# Patient Record
Sex: Female | Born: 1975 | Race: White | Hispanic: No | Marital: Married | Smoking: Never smoker
Health system: Southern US, Community
[De-identification: ages and names within clinical notes are randomized; demographics above are authoritative.]

## PROBLEM LIST (undated history)

## (undated) DIAGNOSIS — J45909 Unspecified asthma, uncomplicated: Secondary | ICD-10-CM

## (undated) HISTORY — PX: BUNIONECTOMY: SHX129

## (undated) HISTORY — PX: APPENDECTOMY: SHX54

## (undated) HISTORY — PX: BREAST CYST EXCISION: SHX579

## (undated) HISTORY — PX: BREAST SURGERY: SHX581

## (undated) HISTORY — DX: Unspecified asthma, uncomplicated: J45.909

---

## 1998-11-25 ENCOUNTER — Other Ambulatory Visit: Admission: RE | Admit: 1998-11-25 | Discharge: 1998-11-25 | Payer: Self-pay | Admitting: *Deleted

## 1999-11-22 ENCOUNTER — Other Ambulatory Visit: Admission: RE | Admit: 1999-11-22 | Discharge: 1999-11-22 | Payer: Self-pay | Admitting: Obstetrics & Gynecology

## 2000-05-13 ENCOUNTER — Inpatient Hospital Stay (HOSPITAL_COMMUNITY): Admission: AD | Admit: 2000-05-13 | Discharge: 2000-05-13 | Payer: Self-pay | Admitting: Obstetrics & Gynecology

## 2000-06-15 ENCOUNTER — Inpatient Hospital Stay (HOSPITAL_COMMUNITY): Admission: AD | Admit: 2000-06-15 | Discharge: 2000-06-17 | Payer: Self-pay | Admitting: Obstetrics and Gynecology

## 2000-07-16 ENCOUNTER — Other Ambulatory Visit: Admission: RE | Admit: 2000-07-16 | Discharge: 2000-07-16 | Payer: Self-pay | Admitting: Obstetrics & Gynecology

## 2001-08-05 ENCOUNTER — Other Ambulatory Visit: Admission: RE | Admit: 2001-08-05 | Discharge: 2001-08-05 | Payer: Self-pay | Admitting: Obstetrics & Gynecology

## 2002-10-08 ENCOUNTER — Other Ambulatory Visit: Admission: RE | Admit: 2002-10-08 | Discharge: 2002-10-08 | Payer: Self-pay | Admitting: Obstetrics & Gynecology

## 2004-10-31 ENCOUNTER — Ambulatory Visit: Payer: Self-pay | Admitting: Family Medicine

## 2005-01-02 ENCOUNTER — Ambulatory Visit: Payer: Self-pay | Admitting: Family Medicine

## 2005-02-08 ENCOUNTER — Ambulatory Visit: Payer: Self-pay | Admitting: Family Medicine

## 2005-03-08 ENCOUNTER — Ambulatory Visit: Payer: Self-pay | Admitting: Family Medicine

## 2005-10-02 ENCOUNTER — Ambulatory Visit: Payer: Self-pay | Admitting: Family Medicine

## 2006-01-04 ENCOUNTER — Ambulatory Visit (HOSPITAL_COMMUNITY): Admission: RE | Admit: 2006-01-04 | Discharge: 2006-01-04 | Payer: Self-pay | Admitting: Obstetrics and Gynecology

## 2006-01-22 ENCOUNTER — Ambulatory Visit: Payer: Self-pay | Admitting: Family Medicine

## 2006-06-22 ENCOUNTER — Inpatient Hospital Stay (HOSPITAL_COMMUNITY): Admission: AD | Admit: 2006-06-22 | Discharge: 2006-06-22 | Payer: Self-pay | Admitting: Obstetrics and Gynecology

## 2006-06-29 ENCOUNTER — Inpatient Hospital Stay (HOSPITAL_COMMUNITY): Admission: AD | Admit: 2006-06-29 | Discharge: 2006-07-01 | Payer: Self-pay | Admitting: Obstetrics & Gynecology

## 2006-11-20 ENCOUNTER — Telehealth: Payer: Self-pay | Admitting: Family Medicine

## 2007-01-09 ENCOUNTER — Ambulatory Visit: Payer: Self-pay | Admitting: Family Medicine

## 2007-01-09 DIAGNOSIS — J069 Acute upper respiratory infection, unspecified: Secondary | ICD-10-CM | POA: Insufficient documentation

## 2007-01-09 DIAGNOSIS — J45909 Unspecified asthma, uncomplicated: Secondary | ICD-10-CM | POA: Insufficient documentation

## 2007-04-28 ENCOUNTER — Encounter: Payer: Self-pay | Admitting: Family Medicine

## 2007-06-30 ENCOUNTER — Ambulatory Visit: Payer: Self-pay | Admitting: Family Medicine

## 2007-06-30 DIAGNOSIS — J329 Chronic sinusitis, unspecified: Secondary | ICD-10-CM | POA: Insufficient documentation

## 2007-07-15 ENCOUNTER — Telehealth: Payer: Self-pay | Admitting: Family Medicine

## 2007-07-22 ENCOUNTER — Ambulatory Visit: Payer: Self-pay | Admitting: Family Medicine

## 2007-07-22 ENCOUNTER — Encounter: Admission: RE | Admit: 2007-07-22 | Discharge: 2007-07-22 | Payer: Self-pay | Admitting: Family Medicine

## 2007-07-22 DIAGNOSIS — R0602 Shortness of breath: Secondary | ICD-10-CM

## 2007-07-22 DIAGNOSIS — R05 Cough: Secondary | ICD-10-CM

## 2007-07-23 LAB — CONVERTED CEMR LAB
Basophils Absolute: 0 10*3/uL (ref 0.0–0.1)
Basophils Relative: 0 % (ref 0–1)
Eosinophils Absolute: 0 10*3/uL (ref 0.0–0.7)
MCHC: 33 g/dL (ref 30.0–36.0)
MCV: 89.6 fL (ref 78.0–100.0)
Neutrophils Relative %: 55 % (ref 43–77)
Platelets: 246 10*3/uL (ref 150–400)

## 2007-09-29 ENCOUNTER — Ambulatory Visit: Payer: Self-pay | Admitting: Family Medicine

## 2007-09-29 DIAGNOSIS — H9209 Otalgia, unspecified ear: Secondary | ICD-10-CM | POA: Insufficient documentation

## 2007-11-04 ENCOUNTER — Telehealth: Payer: Self-pay | Admitting: Family Medicine

## 2008-01-13 ENCOUNTER — Encounter: Payer: Self-pay | Admitting: Family Medicine

## 2010-07-04 NOTE — Consult Note (Signed)
[name], [name] NO.:  [id number]   MEDICAL RECORD NO.:  [id number]          PATIENT TYPE:  MAT   LOCATION:  [facility]C                          FACILITY:  [facility]   PHYSICIAN:  Lenoard Aden, M.D.DATE OF BIRTH:  01-30-1976   DATE OF CONSULTATION:  DATE OF DISCHARGE:                                 CONSULTATION   CHIEF COMPLAINT:  Rule out labor.   She is a 35 year old white female, G2, P1, at 38 weeks' gestation who  presents with right sided discomfort and questionable contractions this  evening.   SHE HAS NO KNOWN DRUG ALLERGIES.   MEDICATIONS:  Prenatal vitamins.   Pregnancy course complicated by cerclage placement with removal at 36  weeks, preterm labor with p.r.n. Procardia use - now discontinued.   She has a past medical history remarkable for:  1. 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  2. History of vaginal delivery.  3. Appendectomy.  4. History of excision of fibroadenoma from her breast.  5. History of migraines.  6. History of pregnancy induced hypertension with her first pregnancy.   FAMILY HISTORY:  Heart disease, hypertension, emphysema, diabetes, brain  cancer, and alcohol abuse.   Pervious pregnancy is a term delivery of an 8-pound 4-ounce female,  complicated by mild pre-eclampsia.   PHYSICAL EXAMINATION:  GENERAL:  She is a well-developed, well-  nourished, white female in no acute distress.  HEENT:  Normal.  LUNGS:  Clear.  NECK:  Supple.  Full range of motion.  HEART:  Regular rhythm.  ABDOMEN:  Soft, gravida, nontender.  Estimated fetal weight 7-1/2 to 8  pounds.  PELVIC:  Cervix is 2-to-3-cm, 80% vertex, 0 station.  EXTREMITIES:  Reveal no cords.  NEUROLOGIC:  Nonfocal.  SKIN:  Intact.   NST is reactive with irregular contractions noted.   IMPRESSION:  1. A 37-week obstetrical patient.  2. Prodromal pattern, no evidence of active labor, no evidence of      cervical change.  3. Musculoskeletal versus fetal positioning  discomfort.  No acute      abdomen or abdominal pain noted.   PLAN:  Reassurance given.  Discharge home.  Labor warnings given.  Fetal  activity discussed.      Lenoard Aden, M.D.  Electronically Signed     RJT/MEDQ  D:  06/22/2006  T:  06/22/2006  Job:  [id number]

## 2010-07-04 NOTE — H&P (Signed)
NAME:  [name], [name]             ACCOUNT NO.:  [id number]   MEDICAL RECORD NO.:  [id number]          PATIENT TYPE:  INP   LOCATION:  9165                          [facility]ILITY:  [facility]   PHYSICIAN:  Lenoard Aden, M.D.DATE OF BIRTH:  06/30/1975   DATE OF ADMISSION:  06/29/2006  DATE OF DISCHARGE:                              HISTORY & PHYSICAL   CHIEF COMPLAINT:  Spontaneous rupture of membranes, questionably this  morning versus last night with fluid draining down her legs as noted  by the patient. She denies contractions. She reports good fetal  movement, and she denies fevers, chills or abdominal pain.   She has no known drug allergies.   MEDICATIONS:  Prenatal vitamins.   She has a history of a LEEP with subsequent cerclage placement this  pregnancy. Otherwise, uncomplicated other than preterm labor requiring  p.r.n. Procardia use. She has a history of appendectomy, history of  vaginal delivery x1, history of gestational hypertension, migraines  headaches.   She is a nonsmoker, nondrinker. She denies domestic or physical  violence.   FAMILY HISTORY:  Heart disease, hypertension, emphysema, diabetes, brain  and colon cancer and alcohol use.   PHYSICAL EXAMINATION:  She is a well-developed, well-nourished, white  female in no acute distress.  HEENT:  Normal.  LUNGS:  Clear.  HEART:  Regular rhythm.  ABDOMEN:  Soft, gravid, nontender. Estimated fetal weight 8 to 8-1/2  pounds. Cervix is 4 cm, 90% vertex, and 0 station.  EXTREMITIES:  No cords.  NEUROLOGICAL EXAM:  Nonfocal.  SKIN:  Intact.   Fetal heart rate is reactive. Contractions are mild and every 5 minutes.   IMPRESSION:  1. A 38-week intrauterine pregnancy.  2. Questionable spontaneous rupture of membranes, not noted on exam.      Questionable slow leak.   PLAN:  Will proceed with augmentation, epidural and attempts at vaginal  delivery as noted. Monitor signs and symptoms for chorioamnionitis as   noted.      Lenoard Aden, M.D.  Electronically Signed     RJT/MEDQ  D:  06/29/2006  T:  06/29/2006  Job:  [id number]   cc:   Lenoard Aden, M.D.  Fax: [phone]

## 2010-07-07 NOTE — Op Note (Signed)
NAMEGEORGEANNA, [name]             ACCOUNT NO.:  [id number]   MEDICAL RECORD NO.:  [id number]          PATIENT TYPE:  AMB   LOCATION:  [facility]C                           [facility]:  [facility]   PHYSICIAN:  Lenoard Aden, M.D.DATE OF BIRTH:  10-11-1975   DATE OF PROCEDURE:  01/04/2006  DATE OF DISCHARGE:                               OPERATIVE REPORT   PREOPERATIVE DIAGNOSES:  1. Thirteen week intrauterine pregnancy with cervical insufficiency.  2. History of loop electrosurgical excision procedure times two.   POSTOPERATIVE DIAGNOSES:  1. Thirteen week intrauterine pregnancy with cervical insufficiency.  2. History of loop electrosurgical excision procedure times two.   PROCEDURE:  McDonald cervical cerclage.   SURGEON:  Lenoard Aden, M.D.   ANESTHESIA:  Spinal.   ESTIMATED BLOOD LOSS:  Less than 50 mL.   COMPLICATIONS:  None.   DRAINS:  None.   COUNTS:  Correct.   Patient to Recovery in good condition.   BRIEF OPERATIVE NOTE:  After being apprised of the risks of anesthesia,  infection, bleeding, __________ need for repair, small risk of  miscarriage, inability to prevent early delivery, the patient was  brought to the operating room after auscultating fetal heart tones.  After achieving adequate anesthesia with spinal, a weighted speculum was  placed.  Feet were placed in the __________ stirrups and cervix was  identified.  After placement of a weighted speculum, the anterior lip of  the cervix was grasped.  A 5 Ethibond suture was placed in the standard  pursestring fashion from 1 o'clock to 11 o'clock, 11 o'clock to 7  o'clock, 7 o'clock to 5 o'clock, 5 o'clock to 2 o'clock, and then 2  o'clock to 1 o'clock and tied down over a Prolene suture.  A good  coaptation of the internal and external cervical os noted.  Minimal  bleeding is noted.  Patient tolerates procedure well, is transferred to  recovery in good condition.     Lenoard Aden, M.D.  Electronically  Signed    RJT/MEDQ  D:  01/04/2006  T:  01/04/2006  Job:  [id number]

## 2010-07-07 NOTE — H&P (Signed)
[facility]  Patient:    [name], [name]                       MRN: [id number] Adm. Date:  [id number] Attending:  Silverio Lay A                         History and Physical  DATE OF BIRTH:                August 02, 1975  HISTORY AND PHYSICAL:         The patient is a 35 year old gravida 1, para 0, AB 0.  Last menstrual period September 18, 1999, for an expected date of delivery Jun 24, 2000, at 38 weeks and 5 days gestation.  REASON FOR ADMISSION:         Induction for borderline PIH.  HISTORY OF PRESENT ILLNESS:   For the last two weeks, the patient showed borderline increase in blood pressure with occasional visual symptoms and increasing generalized edema.  Labs were negative two weeks ago, and were not repeated more recently because of absence of PIH symptoms with complete bed rest.  Fetal movements positive.  No vaginal bleeding.  No fluid leak, and no regular uterine contractions.  Last vaginal exam at the office was favorable, 3 cm dilated, 80-90% effaced, vertex -1 to 0.  PAST MEDICAL HISTORY:         Negative.  PAST SURGICAL HISTORY:        Positive for _______ myomectomy in 1989 and 1990, and appendectomy in 1990.  PAST FAMILIAL HISTORY:        Positive for chronic hypertension, father and MI in father and grandmother.  MEDICATIONS:                  Prenatal vitamins.  SOCIAL HISTORY:               Married, nonsmoker.  LABORATORY DATA:              In the first trimester showed hemoglobin of 13.3, platelets 348, A-positive, antibodies negative, RPR negative, HBsAg negative, HIV negative, rubella positive.  The patient suffered from nausea and vomiting in the first trimester which improved after 16 weeks.  Triple test was within normal limits at 16+ weeks. Hemoglobin was 11.8.  Ultrasound review of anatomy was within normal limits at 28 weeks.  Placenta was normal.  Amniotic fluid normal and _______.  At 28 weeks, one-hour GTT was  within normal limits.  Cervix was reassuring.  In the third trimester group B strep was negative.  Refer to HPI for blood pressures.  REVIEW OF SYSTEMS:            Constitutional negative.  HEENT negative. Cardiovascular negative.  Respiratory negative.  GI negative.  Urologic negative.  Dermatology negative.  Neurologic - see HPI.  Endocrinology negative.  PHYSICAL EXAMINATION:  GENERAL:                      No apparent distress.  VITAL SIGNS:                  Blood pressure on admission 135/85, and then down to 115-120/60-70.  Pulse regular in the 80s.  Temperature 97.4, respiratory rate 18.  LUNGS:                        Clear bilaterally.  HEART:                        Regular cardiac rhythm, no murmur.  ABDOMEN:                      Gravida, uterine height around 38 cm cephalic.  VAGINAL EXAMINATION:          On admission, 2 cm, 75%, vertex -1 to 0.  EXTREMITIES:                  Lower limbs with moderate edema, _______.  DTRs 2/4 bilaterally.  MONITORING:                   Baseline 140 per minute, NST reactive, no decelerations.  No uterine contractions.  IMPRESSION:                   Gravida 1, 38 weeks and 5 days gestation with                               borderline pregnancy-induced hypertension, and                               favorable cervix, group B streptococcus                               negative.  PLAN:                         Admit to labor and delivery, elective induction with artificial rupture of membranes and Pitocin, monitoring, expectant delivery towards probable vaginal delivery. DD:  06/15/00 TD:  06/16/00 Job: [id number]

## 2012-02-09 ENCOUNTER — Emergency Department (INDEPENDENT_AMBULATORY_CARE_PROVIDER_SITE_OTHER)
Admission: EM | Admit: 2012-02-09 | Discharge: 2012-02-09 | Disposition: A | Discharge status: Home / Self Care | Payer: BC Managed Care – PPO | Source: Home / Self Care | Attending: Family Medicine | Admitting: Family Medicine

## 2012-02-09 ENCOUNTER — Encounter: Payer: Self-pay | Admitting: Emergency Medicine

## 2012-02-09 DIAGNOSIS — J029 Acute pharyngitis, unspecified: Secondary | ICD-10-CM

## 2012-02-09 LAB — POCT RAPID STREP A (OFFICE): Rapid Strep A Screen: NEGATIVE

## 2012-02-09 MED ORDER — PENICILLIN V POTASSIUM 500 MG PO TABS: 500.0000 mg | ORAL_TABLET | Freq: Three times a day (TID) | ORAL | Status: DC

## 2012-02-09 NOTE — ED Notes (Signed)
Reports fever and sore throat x 3 days; works in elementary school where STrep is present. Did have Flu vaccination this season in August.

## 2012-02-09 NOTE — ED Provider Notes (Signed)
History     CSN: [id number]  Arrival date & time 02/09/12  0909   First MD Initiated Contact with Patient 02/09/12 [phone]      Chief Complaint  Patient presents with  . Fever  . Sore Throat   HPI  SORE THROAT  Onset: 3 days  Description: sore throat, trouble swallowing, fever, mild sinus drainage and cough  Modifying factors: works in school as Doctor, general practice, has had multiple exposures to children with strep throat.   Symptoms  Fever:  yes URI symptoms: mild Cough: mild Headache: no Rash:  no Swollen glands:   yes Recent Strep Exposure: yes LUQ pain: no Heartburn/brash: no Allergy Symptoms: seasonal  Red Flags STD exposure: no Breathing difficulty: no Drooling: no Trismus: no   History reviewed. No pertinent past medical history.  Past Surgical History  Procedure Date  . Appendectomy   . Bunionectomy   . Breast surgery     Family History  Problem Relation Age of Onset  . Diabetes Mother   . Heart failure Father   . Diabetes Sister     History  Substance Use Topics  . Smoking status: Never Smoker   . Smokeless tobacco: Not on file  . Alcohol Use: Yes    OB History    Grav Para Term Preterm Abortions TAB SAB Ect Mult Living                  Review of Systems  All other systems reviewed and are negative.    Allergies  Review of patient's allergies indicates no known allergies.  Home Medications  No current outpatient prescriptions on file.  BP 116/82  Pulse 115  Temp 99.6 F (37.6 C) (Oral)  Resp 16  Ht 5' 5.5" (1.664 m)  Wt 195 lb (88.451 kg)  BMI 31.96 kg/m2  SpO2 99%  LMP 02/01/2012  Physical Exam  Constitutional: She appears well-developed and well-nourished.  HENT:  Head: Normocephalic and atraumatic.  Right Ear: External ear normal.  Left Ear: External ear normal.  Mouth/Throat: Oropharyngeal exudate present.       Mild +nasal erythema, rhinorrhea bilaterally, + post oropharyngeal erythema    Neck: Normal range  of motion. Neck supple.  Cardiovascular: Normal rate, regular rhythm and normal heart sounds.   Pulmonary/Chest: Effort normal and breath sounds normal.  Abdominal: Soft.  Musculoskeletal: Normal range of motion.  Lymphadenopathy:    She has no cervical adenopathy.  Neurological: She is alert.  Skin: Skin is warm.    ED Course  Procedures (including critical care time)  Labs Reviewed - No data to display No results found.   1. Pharyngitis       MDM  Centor score 2-3.  Rapid strep negative. Will culture.  Will treat with pen vk given multiple strep throat exposures.  Discussed that there may be a viral/allergic component to sxs if strep culture is negative.  Discussed general care and infectious/airway red flags.  Otherwise follow up as needed.      The patient and/or caregiver has been counseled thoroughly with regard to treatment plan and/or medications prescribed including dosage, schedule, interactions, rationale for use, and possible side effects and they verbalize understanding. Diagnoses and expected course of recovery discussed and will return if not improved as expected or if the condition worsens. Patient and/or caregiver verbalized understanding.            Doree Albee, MD 02/09/12 [phone]

## 2012-02-10 LAB — STREP A DNA PROBE: GASP: NEGATIVE

## 2012-02-11 ENCOUNTER — Telehealth: Payer: Self-pay | Admitting: *Deleted

## 2017-03-29 ENCOUNTER — Other Ambulatory Visit: Payer: Self-pay | Admitting: Obstetrics and Gynecology

## 2017-03-29 DIAGNOSIS — R928 Other abnormal and inconclusive findings on diagnostic imaging of breast: Secondary | ICD-10-CM

## 2017-04-03 ENCOUNTER — Other Ambulatory Visit: Payer: Self-pay | Admitting: Obstetrics and Gynecology

## 2017-04-03 ENCOUNTER — Ambulatory Visit
Admission: RE | Admit: 2017-04-03 | Discharge: 2017-04-03 | Disposition: A | Discharge status: Home / Self Care | Payer: BC Managed Care – PPO | Source: Ambulatory Visit | Attending: Obstetrics and Gynecology | Admitting: Obstetrics and Gynecology

## 2017-04-03 DIAGNOSIS — N6310 Unspecified lump in the right breast, unspecified quadrant: Secondary | ICD-10-CM

## 2017-04-03 DIAGNOSIS — R928 Other abnormal and inconclusive findings on diagnostic imaging of breast: Secondary | ICD-10-CM

## 2017-09-30 ENCOUNTER — Ambulatory Visit
Admission: RE | Admit: 2017-09-30 | Discharge: 2017-09-30 | Disposition: A | Discharge status: Home / Self Care | Payer: BC Managed Care – PPO | Source: Ambulatory Visit | Attending: Obstetrics and Gynecology | Admitting: Obstetrics and Gynecology

## 2017-09-30 ENCOUNTER — Other Ambulatory Visit: Payer: Self-pay | Admitting: Obstetrics and Gynecology

## 2017-09-30 DIAGNOSIS — N6310 Unspecified lump in the right breast, unspecified quadrant: Secondary | ICD-10-CM

## 2018-04-02 ENCOUNTER — Ambulatory Visit
Admission: RE | Admit: 2018-04-02 | Discharge: 2018-04-02 | Disposition: A | Discharge status: Home / Self Care | Payer: BC Managed Care – PPO | Source: Ambulatory Visit | Attending: Obstetrics and Gynecology | Admitting: Obstetrics and Gynecology

## 2018-04-02 DIAGNOSIS — N6310 Unspecified lump in the right breast, unspecified quadrant: Secondary | ICD-10-CM

## 2018-04-10 ENCOUNTER — Other Ambulatory Visit: Payer: Self-pay | Admitting: Obstetrics and Gynecology

## 2018-04-10 DIAGNOSIS — N6310 Unspecified lump in the right breast, unspecified quadrant: Secondary | ICD-10-CM

## 2018-04-15 ENCOUNTER — Ambulatory Visit
Admission: RE | Admit: 2018-04-15 | Discharge: 2018-04-15 | Disposition: A | Discharge status: Home / Self Care | Payer: BC Managed Care – PPO | Source: Ambulatory Visit | Attending: Obstetrics and Gynecology | Admitting: Obstetrics and Gynecology

## 2018-04-15 DIAGNOSIS — N6310 Unspecified lump in the right breast, unspecified quadrant: Secondary | ICD-10-CM

## 2018-04-17 HISTORY — PX: BREAST BIOPSY: SHX20

## 2019-04-08 ENCOUNTER — Other Ambulatory Visit: Payer: Self-pay | Admitting: Obstetrics and Gynecology

## 2019-04-08 DIAGNOSIS — Z1231 Encounter for screening mammogram for malignant neoplasm of breast: Secondary | ICD-10-CM

## 2019-05-13 ENCOUNTER — Ambulatory Visit: Payer: BC Managed Care – PPO

## 2019-06-16 ENCOUNTER — Other Ambulatory Visit: Payer: Self-pay

## 2019-06-16 ENCOUNTER — Ambulatory Visit
Admission: RE | Admit: 2019-06-16 | Discharge: 2019-06-16 | Disposition: A | Discharge status: Home / Self Care | Payer: BC Managed Care – PPO | Source: Ambulatory Visit | Attending: Obstetrics and Gynecology | Admitting: Obstetrics and Gynecology

## 2019-06-16 DIAGNOSIS — Z1231 Encounter for screening mammogram for malignant neoplasm of breast: Secondary | ICD-10-CM

## 2020-02-09 ENCOUNTER — Ambulatory Visit: Payer: Self-pay | Admitting: Allergy

## 2020-02-10 ENCOUNTER — Ambulatory Visit: Payer: Self-pay | Admitting: Allergy and Immunology

## 2020-05-04 ENCOUNTER — Ambulatory Visit: Payer: BC Managed Care – PPO | Admitting: Allergy

## 2020-05-04 ENCOUNTER — Other Ambulatory Visit: Payer: Self-pay

## 2020-05-04 ENCOUNTER — Encounter: Payer: Self-pay | Admitting: Allergy

## 2020-05-04 VITALS — BP 104/76 | HR 87 | Temp 98.6°F | Resp 16 | Ht 65.0 in | Wt 226.8 lb

## 2020-05-04 DIAGNOSIS — K9049 Malabsorption due to intolerance, not elsewhere classified: Secondary | ICD-10-CM

## 2020-05-04 DIAGNOSIS — J3089 Other allergic rhinitis: Secondary | ICD-10-CM | POA: Diagnosis not present

## 2020-05-04 DIAGNOSIS — T50905A Adverse effect of unspecified drugs, medicaments and biological substances, initial encounter: Secondary | ICD-10-CM

## 2020-05-04 DIAGNOSIS — T50B95A Adverse effect of other viral vaccines, initial encounter: Secondary | ICD-10-CM

## 2020-05-04 MED ORDER — AZELASTINE HCL 0.1 % NA SOLN: NASAL | 5 refills | Status: AC

## 2020-05-04 NOTE — Patient Instructions (Addendum)
Adverse vaccine effect - reaction following Pfizer 2nd dose - discussed today vaccine testing to [employer] and Moderna vaccines with skin prick testing and intradermal testing.  If skin testing is negative to Pfizer then would proceed with graded vaccine administration in office.  - stop antihistamines for at least 3 days prior to vaccine testing/challenge - would continue to avoid covid vaccine until able to perform testing and/or challenge  Environmental allergy - environmental allergy skin testing is positive to weed pollen, grass pollen - allergen avoidance measures discussed/handouts provided - continue Zyrtec 10 mg daily as needed.   If Zyrtec becomes ineffective other options would be Xyzal or Allegra as long-acting options - for itchy/watery eyes can use over-the-counter Pataday 1 drop each eye daily as needed - for nasal congestion can use nasal steroid spray Rhinocort or Nasacort 2 sprays each nostril daily for 1 to 2 weeks at a time before stopping for maximum benefit - for nasal drainage recommend use of nasal antihistamine Astelin 2 sprays each nostril twice a day as needed - allergen immunotherapy discussed today including protocol, benefits and risk.  Informational handout provided.  If interested in this therapuetic option you can check with your insurance carrier for coverage.  Let us know if you would like to proceed with this option.    Food intolerance - skin testing to gluten products (wheat, barley, oat, rye) and hops is negative - due to symptoms with ingestion would still recommend avoidance - we have discussed the following in regards to foods:   Allergy: food allergy is when you have eaten a food, developed an allergic reaction after eating the food and have IgE to the food (positive food testing either by skin testing or blood testing).  Food allergy could lead to life threatening symptoms  Sensitivity: occurs when you have IgE to a food (positive food testing either by  skin testing or blood testing) but is a food you eat without any issues.  This is not an allergy and we recommend keeping the food in the diet  Intolerance: this is when you have negative testing by either skin testing or blood testing thus not allergic but the food causes symptoms (like belly pain, bloating, diarrhea etc) with ingestion.  These foods should be avoided to prevent symptoms.    Follow-up for vaccine testing with possible challenge

## 2020-05-04 NOTE — Progress Notes (Signed)
New Patient Note  RE: [name] MRN: [id number] DOB: 12-Aug-1975 Date of Office Visit: 05/04/2020  Referring provider: Josephina Gip, NP Primary care provider: Josephina Gip, NP  Chief Complaint: allergic reaction  History of present illness: [name] is a 45 y.o. female presenting today for consultation for reaction to 2nd covid vaccine.   A year ago today she received her 2nd Covid vaccine.  She reports having swollen lips and tongue and throat closure.  She states symptoms started withing 1.5-2 hours after receiving Pfizer vaccine.  She states she went to ED and recieved 2 epi injections and other therapies.  ED report is available however unclear what medication she received.  She did get a prescription for prednisone to continue use after discharge from the ED. She has not had any issues with other vaccines in the past.   She states she did have a procedure where she reports having fissure and polyp evaluation and performed GI clean out without issue.  She does not believe she had general anesthesia with this procedure.  She has not had any dermal fillers in the past.  She feels she might have an issue with gluten and states when she avoids she feels better.  When she eats it she has bloating, abdominal distention and feels bad. With beer she has congestion that starts automatically and also will usually have diarrhea thus she has cut the beer out as well.  She does not notice this with water liquors.  She reports having seasonal allergy symptoms with nasal congestion, voice changes, itchy/watery eyes.  She does report having sinus infections if allergy symptoms are not controlled well.  She takes zyrtec daily in the spring and fall and sometimes in the summer.  Usually doesn't need zyrtec in the winter.  She does use flonase as needed and doesn't feel like it helps with congestion.  She has not used any eyedrops.   She reports history of exercise induced asthma back  when she was running [place] and has an albuterol inhaler for this.  However she states she no longer drives after having foot surgery.  She has not required albuterol use outside of activity/exercise.  Review of systems: Review of Systems  Constitutional: Negative.   HENT:       See HPI  Eyes:       See HPI  Respiratory: Negative.   Cardiovascular: Negative.   Gastrointestinal: Negative.   Musculoskeletal: Negative.   Skin: Negative.   Neurological: Negative.     All other systems negative unless noted above in HPI  Past medical history: Past Medical History:  Diagnosis Date  . Asthma    Exercise-induced    Past surgical history: Past Surgical History:  Procedure Laterality Date  . APPENDECTOMY    . BREAST CYST EXCISION Right    over 20 years ago  . BREAST SURGERY    . BUNIONECTOMY      Family history:  Family History  Problem Relation Age of Onset  . Diabetes Mother   . Heart failure Father   . Diabetes Sister   . Allergic rhinitis Neg Hx   . Angioedema Neg Hx   . Asthma Neg Hx   . Eczema Neg Hx   . Immunodeficiency Neg Hx   . Urticaria Neg Hx     Social history: She lives in a home with carpeting with gas heating and central and window cooling.  2 dogs in the home.  There is no concern  for water damage, mildew or roaches in the home.  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  She denies a smoking history.  Medication List: Current Outpatient Medications  Medication Sig Dispense Refill  . albuterol (VENTOLIN HFA) 108 (90 Base) MCG/ACT inhaler Inhale into the lungs.    Marland Kitchen azelastine (ASTELIN) 0.1 % nasal spray 2 sprays per nostril twice daily as needed for runny nose 30 mL 5  . cetirizine (ZYRTEC) 10 MG tablet Take by mouth.    . Cholecalciferol (VITAMIN D3 PO) Take 1,000 mg by mouth. Take 3 gel caps daily    . meloxicam (MOBIC) 7.5 MG tablet Take by mouth.    . Multiple Vitamin (MULTIVITAMIN) tablet Take 1  tablet by mouth daily.    . Serotonin HCl POWD by Does not apply route.    . vitamin B-12 (CYANOCOBALAMIN) 250 MCG tablet Take by mouth.    . Zinc 50 MG TABS Take by mouth.     No current facility-administered medications for this visit.    Known medication allergies: No Known Allergies   Physical examination: Blood pressure 104/76, pulse 87, temperature 98.6 F (37 C), temperature source Tympanic, resp. rate 16, height 5\' 5"  (1.651 m), weight 226 lb 12.8 oz (102.9 kg), SpO2 100 %.  General: Alert, interactive, in no acute distress. HEENT: PERRLA, TMs pearly gray, turbinates minimally edematous without discharge, post-pharynx non erythematous. Neck: Supple without lymphadenopathy. Lungs: Clear to auscultation without wheezing, rhonchi or rales. {no increased work of breathing. CV: Normal S1, S2 without murmurs. Abdomen: Nondistended, nontender. Skin: Warm and dry, without lesions or rashes. Extremities:  No clubbing, cyanosis or edema. Neuro:   Grossly intact.  Diagnositics/Labs:  Allergy testing: Environmental allergy skin prick testing is positive to mugwort, ash, oak, pecan. Intradermal testing was positive to mold mix 1 and 2. Select food allergy skin prick testing is negative to wheat, oat, barley, rye and hops Allergy testing results were read and interpreted by provider, documented by clinical staff.   Assessment and plan:   Adverse vaccine effect - reaction following Pfizer 2nd dose - discussed today vaccine testing to and Moderna vaccines with skin prick testing and intradermal testing.  If skin testing is negative to Pfizer then would proceed with graded vaccine administration in office.  - stop antihistamines for at least 3 days prior to vaccine testing/challenge - would continue to avoid covid vaccine until able to perform testing and/or challenge  Allergic rhinitis - environmental allergy skin testing is positive to weed pollen, grass pollen - allergen  avoidance measures discussed/handouts provided - continue Zyrtec 10 mg daily as needed.   If Zyrtec becomes ineffective other options would be Xyzal or Allegra as long-acting options - for itchy/watery eyes can use over-the-counter Pataday 1 drop each eye daily as needed - for nasal congestion can use nasal steroid spray Rhinocort or Nasacort 2 sprays each nostril daily for 1 to 2 weeks at a time before stopping for maximum benefit - for nasal drainage recommend use of nasal antihistamine Astelin 2 sprays each nostril twice a day as needed - allergen immunotherapy discussed today including protocol, benefits and risk.  Informational handout provided.  If interested in this therapuetic option you can check with your insurance carrier for coverage.  Let [employer] know if you would like to proceed with this option.    Food intolerance - skin testing to gluten products (wheat, barley, oat, rye) and hops is negative - due to symptoms with ingestion  would still recommend avoidance - we have discussed the following in regards to foods:   Allergy: food allergy is when you have eaten a food, developed an allergic reaction after eating the food and have IgE to the food (positive food testing either by skin testing or blood testing).  Food allergy could lead to life threatening symptoms  Sensitivity: occurs when you have IgE to a food (positive food testing either by skin testing or blood testing) but is a food you eat without any issues.  This is not an allergy and we recommend keeping the food in the diet  Intolerance: this is when you have negative testing by either skin testing or blood testing thus not allergic but the food causes symptoms (like belly pain, bloating, diarrhea etc) with ingestion.  These foods should be avoided to prevent symptoms.    Follow-up for vaccine testing with possible challenge  I appreciate the opportunity to take part in [name]'s care. Please do not hesitate to contact me with  questions.  Sincerely,   Margo Aye, MD Allergy/Immunology [facility]

## 2020-07-26 ENCOUNTER — Other Ambulatory Visit: Payer: Self-pay | Admitting: Obstetrics and Gynecology

## 2020-07-26 DIAGNOSIS — Z1231 Encounter for screening mammogram for malignant neoplasm of breast: Secondary | ICD-10-CM

## 2020-09-01 ENCOUNTER — Ambulatory Visit
Admission: RE | Admit: 2020-09-01 | Discharge: 2020-09-01 | Disposition: A | Discharge status: Home / Self Care | Payer: BC Managed Care – PPO | Source: Ambulatory Visit | Attending: Obstetrics and Gynecology | Admitting: Obstetrics and Gynecology

## 2020-09-01 ENCOUNTER — Other Ambulatory Visit: Payer: Self-pay

## 2020-09-01 DIAGNOSIS — Z1231 Encounter for screening mammogram for malignant neoplasm of breast: Secondary | ICD-10-CM

## 2020-09-20 ENCOUNTER — Ambulatory Visit: Payer: BC Managed Care – PPO

## 2021-01-20 ENCOUNTER — Other Ambulatory Visit: Payer: Self-pay

## 2021-01-20 ENCOUNTER — Emergency Department
Admission: RE | Admit: 2021-01-20 | Discharge: 2021-01-20 | Disposition: A | Discharge status: Home / Self Care | Payer: BC Managed Care – PPO | Source: Ambulatory Visit

## 2021-01-20 ENCOUNTER — Emergency Department (INDEPENDENT_AMBULATORY_CARE_PROVIDER_SITE_OTHER): Payer: BC Managed Care – PPO

## 2021-01-20 VITALS — BP 136/84 | HR 97 | Temp 98.4°F | Resp 18

## 2021-01-20 DIAGNOSIS — R062 Wheezing: Secondary | ICD-10-CM | POA: Diagnosis not present

## 2021-01-20 DIAGNOSIS — R059 Cough, unspecified: Secondary | ICD-10-CM

## 2021-01-20 DIAGNOSIS — J309 Allergic rhinitis, unspecified: Secondary | ICD-10-CM

## 2021-01-20 DIAGNOSIS — J0100 Acute maxillary sinusitis, unspecified: Secondary | ICD-10-CM

## 2021-01-20 MED ORDER — CEFDINIR 300 MG PO CAPS: 300.0000 mg | ORAL_CAPSULE | Freq: Two times a day (BID) | ORAL | 0 refills | Status: AC

## 2021-01-20 MED ORDER — BENZONATATE 200 MG PO CAPS: 200.0000 mg | ORAL_CAPSULE | Freq: Three times a day (TID) | ORAL | 0 refills | Status: AC | PRN

## 2021-01-20 MED ORDER — PREDNISONE 20 MG PO TABS: ORAL_TABLET | ORAL | 0 refills | Status: DC

## 2021-01-20 MED ORDER — FEXOFENADINE HCL 180 MG PO TABS: 180.0000 mg | ORAL_TABLET | Freq: Every day | ORAL | 0 refills | Status: DC

## 2021-01-20 NOTE — Discharge Instructions (Addendum)
Advised patient to take medication as directed with food to completion.  Encouraged patient to increase daily water intake preferably 48 ounces per day while taking these medications.  Asked patient to take prednisone burst and (discontinue Zyrtec) Allegra with first dose of cefdinir for 5 of 10-day antibiotic course.  May use Allegra afterwards as needed for concurrent postnasal drainage/drip.  Advised patient may use Tessalon Perles daily or as needed for cough.

## 2021-01-20 NOTE — ED Triage Notes (Signed)
Pt c/o  cough and congestion x 2 weeks. Had covid in summertime. COVID test neg 11/21 at home. Mucinex, sudafed and robitussin prn. Denies fever.

## 2021-01-20 NOTE — ED Provider Notes (Signed)
[name]    CSN: [id number] Arrival date & time: 01/20/21  0959      History   Chief Complaint Chief Complaint  Patient presents with   Cough    Appt 10am    HPI [name] is a 45 y.o. female.   HPI 46 year old female presents with cough and congestion for 2 weeks.  Reports using Mucinex, Sudafed and Robitussin as needed.  Denies fever.  PMH significant for cough, reactive airway disease, and URI.  Past Medical History:  Diagnosis Date   Asthma    Exercise-induced    Patient Active Problem List   Diagnosis Date Noted   EAR PAIN, LEFT 09/29/2007   SOB 07/22/2007   COUGH 07/22/2007   SINUSITIS 06/30/2007   URI 01/09/2007   REACTIVE AIRWAY DISEASE 01/09/2007    Past Surgical History:  Procedure Laterality Date   APPENDECTOMY     BREAST BIOPSY Right 04/17/2018   BREAST CYST EXCISION Right    over 20 years ago   BREAST SURGERY     BUNIONECTOMY      OB History   No obstetric history on file.      Home Medications    Prior to Admission medications   Medication Sig Start Date End Date Taking? Authorizing Provider  benzonatate (TESSALON) 200 MG capsule Take 1 capsule (200 mg total) by mouth 3 (three) times daily as needed for up to 7 days for cough. 01/20/21 01/27/21 Yes Trevor Iha, FNP  cefdinir (OMNICEF) 300 MG capsule Take 1 capsule (300 mg total) by mouth 2 (two) times daily for 7 days. 01/20/21 01/27/21 Yes Trevor Iha, FNP  fexofenadine [facility] ALLERGY) 180 MG tablet Take 1 tablet (180 mg total) by mouth daily for 15 days. 01/20/21 02/04/21 Yes Trevor Iha, FNP  predniSONE (DELTASONE) 20 MG tablet Take 3 tabs PO daily x 5 days. 01/20/21  Yes Trevor Iha, FNP  albuterol (VENTOLIN HFA) 108 (90 Base) MCG/ACT inhaler Inhale into the lungs.    [provider]  azelastine (ASTELIN) 0.1 % nasal spray 2 sprays per nostril twice daily as needed for runny nose 05/04/20   Marcelyn Bruins, MD  cetirizine (ZYRTEC) 10 MG  tablet Take by mouth.    [provider]  Cholecalciferol (VITAMIN D3 PO) Take 1,000 mg by mouth. Take 3 gel caps daily    [provider]  meloxicam (MOBIC) 7.5 MG tablet Take by mouth. 12/20/16   [provider]  Multiple Vitamin (MULTIVITAMIN) tablet Take 1 tablet by mouth daily.    [provider]  Serotonin HCl POWD by Does not apply route.    [provider]  vitamin B-12 (CYANOCOBALAMIN) 250 MCG tablet Take by mouth.    [provider]  Zinc 50 MG TABS Take by mouth.    [provider]    Family History Family History  Problem Relation Age of Onset   Diabetes Mother    Heart failure Father    Diabetes Sister    Allergic rhinitis Neg Hx    Angioedema Neg Hx    Asthma Neg Hx    Eczema Neg Hx    Immunodeficiency Neg Hx    Urticaria Neg Hx     Social History Social History   Tobacco Use   Smoking status: Never   Smokeless tobacco: Never  Vaping Use   Vaping Use: Never used  Substance Use Topics   Alcohol use: Yes   Drug use: No     Allergies  Patient has no known allergies.   Review of Systems Review of Systems  HENT:  Positive for congestion.   Respiratory:  Positive for cough.   All other systems reviewed and are negative.   Physical Exam Triage Vital Signs ED Triage Vitals  Enc Vitals Group     BP 01/20/21 1028 136/84     Pulse Rate 01/20/21 1028 97     Resp 01/20/21 1028 18     Temp 01/20/21 1028 98.4 F (36.9 C)     Temp Source 01/20/21 1028 Oral     SpO2 01/20/21 1028 97 %     Weight --      Height --      Head Circumference --      Peak Flow --      Pain Score 01/20/21 1031 0     Pain Loc --      Pain Edu? --      Excl. in GC? --    No data found.  Updated Vital Signs BP 136/84 (BP Location: Right Arm)   Pulse 97   Temp 98.4 F (36.9 C) (Oral)   Resp 18   LMP 01/09/2021   SpO2 97%   Physical Exam Vitals and nursing note reviewed.  Constitutional:      General: She  is not in acute distress.    Appearance: Normal appearance. She is normal weight. She is not ill-appearing.  HENT:     Head: Normocephalic and atraumatic.     Right Ear: Tympanic membrane, ear canal and external ear normal.     Left Ear: Tympanic membrane, ear canal and external ear normal.     Nose: Nose normal.     Mouth/Throat:     Mouth: Mucous membranes are moist.     Pharynx: Oropharynx is clear.  Eyes:     Extraocular Movements: Extraocular movements intact.     Conjunctiva/sclera: Conjunctivae normal.     Pupils: Pupils are equal, round, and reactive to light.  Cardiovascular:     Rate and Rhythm: Normal rate and regular rhythm.     Pulses: Normal pulses.     Heart sounds: Normal heart sounds.  Pulmonary:     Effort: Pulmonary effort is normal.     Breath sounds: Normal breath sounds.  Musculoskeletal:        General: Normal range of motion.     Cervical back: Normal range of motion and neck supple.  Skin:    General: Skin is warm and dry.  Neurological:     General: No focal deficit present.     Mental Status: She is alert and oriented to person, place, and time. Mental status is at baseline.     UC Treatments / Results  Labs (all labs ordered are listed, but only abnormal results are displayed) Labs Reviewed - No data to display  EKG   Radiology DG Chest 2 View  Result Date: 01/20/2021 CLINICAL DATA:  Cough and wheezing for 2 weeks. EXAM: CHEST - 2 VIEW COMPARISON:  07/22/2007 FINDINGS: The heart size and mediastinal contours are within normal limits. Both lungs are clear. Chronic deformity of the left lateral 6th rib is unchanged in appearance. IMPRESSION: No active cardiopulmonary disease. Electronically Signed   By: Danae Orleans M.D.   On: 01/20/2021 10:47    Procedures Procedures (including critical care time)  Medications Ordered in UC Medications - No data to display  Initial Impression / Assessment and Plan / UC Course  I have reviewed  the triage  vital signs and the nursing notes.  Pertinent labs & imaging results that were available during my care of the patient were reviewed by me and considered in my medical decision making (see chart for details).     MDM: 1.  Cough-CXR negative for acute cardiopulmonary process Rx'd Prednisone burst and Tessalon Perles; 2.  Subacute maxillary sinusitis-Rx'd cefdinir; 3.  Allergic rhinitis-Rx'd Allegra. Advised patient to take medication as directed with food to completion.  Encouraged patient to increase daily water intake preferably 48 ounces per day while taking these medications.  Asked patient to take prednisone burst and Allegra with first dose of cefdinir for 5 of 10-day antibiotic course.  May use Allegra afterwards as needed for concurrent postnasal drainage/drip.  Advised patient may use Tessalon Perles daily or as needed for cough.  Work note provided prior to discharge discharged home, hemodynamically stable. Final Clinical Impressions(s) / UC Diagnoses   Final diagnoses:  Cough, unspecified type  Subacute maxillary sinusitis  Allergic rhinitis, unspecified seasonality, unspecified trigger     Discharge Instructions      Advised patient to take medication as directed with food to completion.  Encouraged patient to increase daily water intake preferably 48 ounces per day while taking these medications.  Asked patient to take prednisone burst and (discontinue Zyrtec) Allegra with first dose of cefdinir for 5 of 10-day antibiotic course.  May use Allegra afterwards as needed for concurrent postnasal drainage/drip.  Advised patient may use Tessalon Perles daily or as needed for cough.     ED Prescriptions     Medication Sig Dispense Auth. Provider   cefdinir (OMNICEF) 300 MG capsule Take 1 capsule (300 mg total) by mouth 2 (two) times daily for 7 days. 14 capsule Trevor Iha, FNP   predniSONE (DELTASONE) 20 MG tablet Take 3 tabs PO daily x 5 days. 15 tablet Trevor Iha, FNP    fexofenadine [facility] ALLERGY) 180 MG tablet Take 1 tablet (180 mg total) by mouth daily for 15 days. 15 tablet Trevor Iha, FNP   benzonatate (TESSALON) 200 MG capsule Take 1 capsule (200 mg total) by mouth 3 (three) times daily as needed for up to 7 days for cough. 40 capsule Trevor Iha, FNP      PDMP not reviewed this encounter.   Trevor Iha, FNP 01/20/21 1227

## 2021-02-03 ENCOUNTER — Telehealth: Payer: BC Managed Care – PPO | Admitting: Family

## 2021-02-03 DIAGNOSIS — J329 Chronic sinusitis, unspecified: Secondary | ICD-10-CM | POA: Diagnosis not present

## 2021-02-03 MED ORDER — DOXYCYCLINE HYCLATE 100 MG PO TABS: 100.0000 mg | ORAL_TABLET | Freq: Two times a day (BID) | ORAL | 0 refills | Status: DC

## 2021-02-03 NOTE — Progress Notes (Signed)
Virtual Visit Consent   PA TENNANT, you are scheduled for a virtual visit with a [facility] provider today.     Just as with appointments in the office, your consent must be obtained to participate.  Your consent will be active for this visit and any virtual visit you may have with one of our providers in the next 365 days.     If you have a MyChart account, a copy of this consent can be sent to you electronically.  All virtual visits are billed to your insurance company just like a traditional visit in the office.    As this is a virtual visit, video technology does not allow for your provider to perform a traditional examination.  This may limit your provider's ability to fully assess your condition.  If your provider identifies any concerns that need to be evaluated in person or the need to arrange testing (such as labs, EKG, etc.), we will make arrangements to do so.     Although advances in technology are sophisticated, we cannot ensure that it will always work on either your end or our end.  If the connection with a video visit is poor, the visit may have to be switched to a telephone visit.  With either a video or telephone visit, we are not always able to ensure that we have a secure connection.     I need to obtain your verbal consent now.   Are you willing to proceed with your visit today?    [name] has provided verbal consent on 02/03/2021 for a virtual visit (video or telephone).   Jannifer Rodney, FNP   Date: 02/03/2021 11:33 AM   Virtual Visit via Video Note   I, Jannifer Rodney, connected with  [name]  ([id number], Jul 15, 1975) on 02/03/21 at 11:30 AM EST by a video-enabled telemedicine application and verified that I am speaking with the correct person using two identifiers.  Location: Patient: Virtual Visit Location Patient: Other: work Provider: Pharmacist, community: Home Office   I discussed the limitations of evaluation and  management by telemedicine and the availability of in person appointments. The patient expressed understanding and agreed to proceed.    History of Present Illness: [name] is a 45 y.o. who identifies as a female who was assigned female at birth, and is being seen today for cough that started 01/06/21. She went to the Urgent Care on 01/20/21 and was given prednisone and cefdinir, allegra, and tessalon. She reports her cough improved, but continues to cough.   HPI: Sinusitis This is a recurrent problem. The current episode started 1 to 4 weeks ago. The problem has been waxing and waning since onset. There has been no fever. Her pain is at a severity of 5/10. The pain is moderate. Associated symptoms include congestion, coughing, ear pain, headaches, a hoarse voice, sinus pressure and a sore throat. Pertinent negatives include no shortness of breath or sneezing. Past treatments include oral decongestants. The treatment provided mild relief.   Problems:  Patient Active Problem List   Diagnosis Date Noted   EAR PAIN, LEFT 09/29/2007   SOB 07/22/2007   COUGH 07/22/2007   SINUSITIS 06/30/2007   URI 01/09/2007   REACTIVE AIRWAY DISEASE 01/09/2007    Allergies: No Known Allergies Medications:  Current Outpatient Medications:    doxycycline (VIBRA-TABS) 100 MG tablet, Take 1 tablet (100 mg total) by mouth 2 (two) times daily., Disp: 20 tablet, Rfl: 0  albuterol (VENTOLIN HFA) 108 (90 Base) MCG/ACT inhaler, Inhale into the lungs., Disp: , Rfl:    azelastine (ASTELIN) 0.1 % nasal spray, 2 sprays per nostril twice daily as needed for runny nose, Disp: 30 mL, Rfl: 5   cetirizine (ZYRTEC) 10 MG tablet, Take by mouth., Disp: , Rfl:    Cholecalciferol (VITAMIN D3 PO), Take 1,000 mg by mouth. Take 3 gel caps daily, Disp: , Rfl:    fexofenadine (ALLEGRA ALLERGY) 180 MG tablet, Take 1 tablet (180 mg total) by mouth daily for 15 days., Disp: 15 tablet, Rfl: 0   meloxicam (MOBIC) 7.5 MG tablet, Take  by mouth., Disp: , Rfl:    Multiple Vitamin (MULTIVITAMIN) tablet, Take 1 tablet by mouth daily., Disp: , Rfl:    Serotonin HCl POWD, by Does not apply route., Disp: , Rfl:    vitamin B-12 (CYANOCOBALAMIN) 250 MCG tablet, Take by mouth., Disp: , Rfl:    Zinc 50 MG TABS, Take by mouth., Disp: , Rfl:   Observations/Objective: Patient is well-developed, well-nourished in no acute distress.  Resting comfortably   Head is normocephalic, atraumatic.  No labored breathing.  Speech is clear and coherent with logical content.  Patient is alert and oriented at baseline.  Nasal congestion  Intermittent cough  Assessment and Plan: 1. Recurrent sinusitis - doxycycline (VIBRA-TABS) 100 MG tablet; Take 1 tablet (100 mg total) by mouth 2 (two) times daily.  Dispense: 20 tablet; Refill: 0 - Take meds as prescribed - Use a cool mist humidifier  -Use saline nose sprays frequently -Force fluids -For any cough or congestion  Use plain Mucinex- regular strength or max strength is fine -For fever or aces or pains- take tylenol or ibuprofen. -Throat lozenges if help Follow up is symptoms worsen or do not improve  Follow Up Instructions: I discussed the assessment and treatment plan with the patient. The patient was provided an opportunity to ask questions and all were answered. The patient agreed with the plan and demonstrated an understanding of the instructions.  A copy of instructions were sent to the patient via MyChart unless otherwise noted below.    The patient was advised to call back or seek an in-person evaluation if the symptoms worsen or if the condition fails to improve as anticipated.  Time:  I spent 8 minutes with the patient via telehealth technology discussing the above problems/concerns.    Jannifer Rodney, FNP

## 2021-08-02 ENCOUNTER — Other Ambulatory Visit: Payer: Self-pay | Admitting: Family Medicine

## 2021-08-02 DIAGNOSIS — Z1231 Encounter for screening mammogram for malignant neoplasm of breast: Secondary | ICD-10-CM

## 2021-09-06 ENCOUNTER — Ambulatory Visit
Admission: RE | Admit: 2021-09-06 | Discharge: 2021-09-06 | Disposition: A | Discharge status: Home / Self Care | Payer: BC Managed Care – PPO | Source: Ambulatory Visit | Attending: Family Medicine | Admitting: Family Medicine

## 2021-09-06 DIAGNOSIS — Z1231 Encounter for screening mammogram for malignant neoplasm of breast: Secondary | ICD-10-CM

## 2021-09-08 ENCOUNTER — Other Ambulatory Visit: Payer: Self-pay | Admitting: Family Medicine

## 2021-09-08 DIAGNOSIS — R928 Other abnormal and inconclusive findings on diagnostic imaging of breast: Secondary | ICD-10-CM

## 2021-09-15 ENCOUNTER — Ambulatory Visit
Admission: RE | Admit: 2021-09-15 | Discharge: 2021-09-15 | Disposition: A | Discharge status: Home / Self Care | Payer: BC Managed Care – PPO | Source: Ambulatory Visit | Attending: Family Medicine | Admitting: Family Medicine

## 2021-09-15 ENCOUNTER — Other Ambulatory Visit: Payer: Self-pay | Admitting: Family Medicine

## 2021-09-15 DIAGNOSIS — R928 Other abnormal and inconclusive findings on diagnostic imaging of breast: Secondary | ICD-10-CM

## 2021-09-15 DIAGNOSIS — N6310 Unspecified lump in the right breast, unspecified quadrant: Secondary | ICD-10-CM

## 2021-09-19 ENCOUNTER — Other Ambulatory Visit: Payer: Self-pay | Admitting: Family Medicine

## 2022-01-04 ENCOUNTER — Ambulatory Visit (INDEPENDENT_AMBULATORY_CARE_PROVIDER_SITE_OTHER): Payer: BC Managed Care – PPO

## 2022-01-04 ENCOUNTER — Ambulatory Visit
Admission: EM | Admit: 2022-01-04 | Discharge: 2022-01-04 | Disposition: A | Discharge status: Home / Self Care | Payer: BC Managed Care – PPO | Attending: Family Medicine | Admitting: Family Medicine

## 2022-01-04 DIAGNOSIS — M659 Synovitis and tenosynovitis, unspecified: Secondary | ICD-10-CM | POA: Diagnosis not present

## 2022-01-04 DIAGNOSIS — M79671 Pain in right foot: Secondary | ICD-10-CM | POA: Diagnosis not present

## 2022-01-04 MED ORDER — HYDROCODONE-ACETAMINOPHEN 5-325 MG PO TABS: 1.0000 | ORAL_TABLET | Freq: Four times a day (QID) | ORAL | 0 refills | Status: DC | PRN

## 2022-01-04 MED ORDER — METHYLPREDNISOLONE 4 MG PO TBPK: ORAL_TABLET | ORAL | 0 refills | Status: DC

## 2022-01-04 NOTE — ED Provider Notes (Signed)
[name]    CSN: [id number] Arrival date & time: 01/04/22  1741      History   Chief Complaint Chief Complaint  Patient presents with   Foot Pain    HPI [name] is a 46 y.o. female.   HPI  Patient states she has foot pain for no reason.  About a week ago she had some soreness that seem to go away.  Yesterday she went on a long walk because the weather was nice and this morning when she got up her foot was really hurting.  She can hardly put weight on it.  She has been taking 800 to 1000 mg of ibuprofen all day just to get through the day.  Can hardly put weight secondary to pain.  No prior history of foot problems  Past Medical History:  Diagnosis Date   Asthma    Exercise-induced    Patient Active Problem List   Diagnosis Date Noted   EAR PAIN, LEFT 09/29/2007   SOB 07/22/2007   COUGH 07/22/2007   SINUSITIS 06/30/2007   URI 01/09/2007   REACTIVE AIRWAY DISEASE 01/09/2007    Past Surgical History:  Procedure Laterality Date   APPENDECTOMY     BREAST BIOPSY Right 04/17/2018   BREAST CYST EXCISION Right    over 20 years ago   BREAST SURGERY     BUNIONECTOMY      OB History   No obstetric history on file.      Home Medications    Prior to Admission medications   Medication Sig Start Date End Date Taking? Authorizing Provider  HYDROcodone-acetaminophen (NORCO/VICODIN) 5-325 MG tablet Take 1-2 tablets by mouth every 6 (six) hours as needed. MDD=6 01/04/22  Yes Eustace Moore, MD  methylPREDNISolone (MEDROL DOSEPAK) 4 MG TBPK tablet tad 01/04/22  Yes Eustace Moore, MD  albuterol (VENTOLIN HFA) 108 (90 Base) MCG/ACT inhaler Inhale into the lungs.    [provider]  azelastine (ASTELIN) 0.1 % nasal spray 2 sprays per nostril twice daily as needed for runny nose 05/04/20   Marcelyn Bruins, MD  cetirizine (ZYRTEC) 10 MG tablet Take by mouth.    [provider]  Zinc 50 MG TABS Take by mouth.     [provider]    Family History Family History  Problem Relation Age of Onset   Diabetes Mother    Heart failure Father    Diabetes Sister    Allergic rhinitis Neg Hx    Angioedema Neg Hx    Asthma Neg Hx    Eczema Neg Hx    Immunodeficiency Neg Hx    Urticaria Neg Hx     Social History Social History   Tobacco Use   Smoking status: Never   Smokeless tobacco: Never  Vaping Use   Vaping Use: Never used  Substance Use Topics   Alcohol use: Yes    Alcohol/week: 5.0 standard drinks of alcohol    Types: 5 Standard drinks or equivalent per week    Comment: on weekends   Drug use: No     Allergies   Patient has no known allergies.   Review of Systems Review of Systems See HPI  Physical Exam Triage Vital Signs ED Triage Vitals  Enc Vitals Group     BP 01/04/22 1830 112/75     Pulse Rate 01/04/22 1830 83     Resp 01/04/22 1830 20     Temp 01/04/22 1830 98.4 F (36.9 C)  Temp Source 01/04/22 1830 Oral     SpO2 01/04/22 1833 98 %     Weight 01/04/22 1827 230 lb (104.3 kg)     Height 01/04/22 1827 5\' 6"  (1.676 m)     Head Circumference --      Peak Flow --      Pain Score 01/04/22 1827 5     Pain Loc --      Pain Edu? --      Excl. in GC? --    No data found.  Updated Vital Signs BP 112/75 (BP Location: Left Arm)   Pulse 83   Temp 98.4 F (36.9 C) (Oral)   Resp 20   Ht 5\' 6"  (1.676 m)   Wt 104.3 kg   LMP 12/19/2021 (Exact Date)   SpO2 98%   BMI 37.12 kg/m       Physical Exam Constitutional:      General: She is not in acute distress.    Appearance: She is well-developed. She is obese. She is ill-appearing.     Comments: In wheelchair.  Appears uncomfortable  HENT:     Head: Normocephalic and atraumatic.  Eyes:     Conjunctiva/sclera: Conjunctivae normal.     Pupils: Pupils are equal, round, and reactive to light.  Cardiovascular:     Rate and Rhythm: Normal rate.  Pulmonary:     Effort: Pulmonary effort is normal. No  respiratory distress.  Abdominal:     General: There is no distension.     Palpations: Abdomen is soft.  Musculoskeletal:        General: Normal range of motion.     Cervical back: Normal range of motion.       Feet:  Skin:    General: Skin is warm and dry.  Neurological:     Mental Status: She is alert.     Gait: Gait abnormal.      UC Treatments / Results  Labs (all labs ordered are listed, but only abnormal results are displayed) Labs Reviewed - No data to display  EKG   Radiology DG Foot Complete Right  Result Date: 01/04/2022 CLINICAL DATA:  Right foot pain.  No known injury. EXAM: RIGHT FOOT COMPLETE - 3+ VIEW COMPARISON:  None Available. FINDINGS: There is no evidence of fracture or dislocation. There is no evidence of arthropathy or other focal bone abnormality. Soft tissues are unremarkable. IMPRESSION: Negative. Electronically Signed   By: 12/21/2021 M.D.   On: 01/04/2022 19:11    Procedures Procedures (including critical care time)  Medications Ordered in UC Medications - No data to display  Initial Impression / Assessment and Plan / UC Course  I have reviewed the triage vital signs and the nursing notes.  Pertinent labs & imaging results that were available during my care of the patient were reviewed by me and considered in my medical decision making (see chart for details).     She likely has an overuse injury or synovitis.  We will treat with prednisone and rest.  Follow-up if fails to improve Final Clinical Impressions(s) / UC Diagnoses   Final diagnoses:  Synovitis of toe     Discharge Instructions      Limit walking while foot is painful Try ice or heat to painful area Wear the fracture shoe while foot is painful Take prednisone as directed Ibuprofen while you are on prednisone Take hydrocodone if pain is severe.  Do not drive on hydrocodone     ED Prescriptions  Medication Sig Dispense Auth. Provider   methylPREDNISolone  (MEDROL DOSEPAK) 4 MG TBPK tablet tad 21 tablet Eustace Moore, MD   HYDROcodone-acetaminophen (NORCO/VICODIN) 5-325 MG tablet Take 1-2 tablets by mouth every 6 (six) hours as needed. MDD=6 10 tablet Eustace Moore, MD      I have reviewed the PDMP during this encounter.   Eustace Moore, MD 01/04/22 Rosamaria Lints

## 2022-01-04 NOTE — Discharge Instructions (Addendum)
Limit walking while foot is painful Try ice or heat to painful area Wear the fracture shoe while foot is painful Take prednisone as directed Ibuprofen while you are on prednisone Take hydrocodone if pain is severe.  Do not drive on hydrocodone

## 2022-01-04 NOTE — ED Triage Notes (Signed)
Pt presents to Urgent Care with c/o worsening R foot pain x 1 week. No known injury. States pain starts at the base of 1st and 2nd toes and extends to peripheral toes. Hard to ambulate. Has been taking ibuprofen.

## 2022-03-19 ENCOUNTER — Ambulatory Visit
Admission: RE | Admit: 2022-03-19 | Discharge: 2022-03-19 | Disposition: A | Discharge status: Home / Self Care | Payer: BC Managed Care – PPO | Source: Ambulatory Visit | Attending: Family Medicine | Admitting: Family Medicine

## 2022-03-19 ENCOUNTER — Other Ambulatory Visit: Payer: Self-pay | Admitting: Family Medicine

## 2022-03-19 DIAGNOSIS — N6310 Unspecified lump in the right breast, unspecified quadrant: Secondary | ICD-10-CM

## 2022-09-12 ENCOUNTER — Other Ambulatory Visit: Payer: BC Managed Care – PPO

## 2022-09-18 ENCOUNTER — Ambulatory Visit
Admission: RE | Admit: 2022-09-18 | Discharge: 2022-09-18 | Disposition: A | Discharge status: Home / Self Care | Payer: BC Managed Care – PPO | Source: Ambulatory Visit | Attending: Family Medicine | Admitting: Family Medicine

## 2022-09-18 DIAGNOSIS — N6310 Unspecified lump in the right breast, unspecified quadrant: Secondary | ICD-10-CM

## 2022-09-19 ENCOUNTER — Ambulatory Visit
Admission: RE | Admit: 2022-09-19 | Discharge: 2022-09-19 | Disposition: A | Discharge status: Home / Self Care | Payer: BC Managed Care – PPO | Source: Ambulatory Visit | Attending: Family Medicine | Admitting: Family Medicine

## 2022-09-19 VITALS — BP 128/84 | HR 84 | Temp 98.9°F | Resp 18 | Ht 66.0 in | Wt 235.0 lb

## 2022-09-19 DIAGNOSIS — R059 Cough, unspecified: Secondary | ICD-10-CM

## 2022-09-19 DIAGNOSIS — J0100 Acute maxillary sinusitis, unspecified: Secondary | ICD-10-CM | POA: Diagnosis not present

## 2022-09-19 DIAGNOSIS — J309 Allergic rhinitis, unspecified: Secondary | ICD-10-CM | POA: Diagnosis not present

## 2022-09-19 DIAGNOSIS — J3489 Other specified disorders of nose and nasal sinuses: Secondary | ICD-10-CM | POA: Diagnosis not present

## 2022-09-19 MED ORDER — AMOXICILLIN-POT CLAVULANATE 875-125 MG PO TABS: 1.0000 | ORAL_TABLET | Freq: Two times a day (BID) | ORAL | 0 refills | Status: AC

## 2022-09-19 MED ORDER — PREDNISONE 10 MG (21) PO TBPK: ORAL_TABLET | Freq: Every day | ORAL | 0 refills | Status: AC

## 2022-09-19 MED ORDER — BENZONATATE 200 MG PO CAPS: 200.0000 mg | ORAL_CAPSULE | Freq: Three times a day (TID) | ORAL | 0 refills | Status: AC | PRN

## 2022-09-19 MED ORDER — FEXOFENADINE HCL 180 MG PO TABS: 180.0000 mg | ORAL_TABLET | Freq: Every day | ORAL | 0 refills | Status: AC

## 2022-09-19 NOTE — ED Provider Notes (Signed)
[name] [facility]    CSN: [id number] Arrival date & time: 09/19/22  1557      History   Chief Complaint Chief Complaint  Patient presents with   Nasal Congestion    I think I have a sinus infection that I can't shake. - Entered by patient    HPI [name] is a 47 y.o. female.   HPI Pleasant 47 year old female presents with sinus nasal congestion, green nasal drainage/discharge, and bilateral ear pain.  Reports that she may be dealing with a sinus infection.  PMH significant for obesity, reactive airway disease, and recurrent sinusitis.  Past Medical History:  Diagnosis Date   Asthma    Exercise-induced    Patient Active Problem List   Diagnosis Date Noted   EAR PAIN, LEFT 09/29/2007   SOB 07/22/2007   COUGH 07/22/2007   SINUSITIS 06/30/2007   URI 01/09/2007   REACTIVE AIRWAY DISEASE 01/09/2007    Past Surgical History:  Procedure Laterality Date   APPENDECTOMY     BREAST BIOPSY Right 04/17/2018   BREAST CYST EXCISION Right    over 20 years ago   BREAST SURGERY     BUNIONECTOMY      OB History   No obstetric history on file.      Home Medications    Prior to Admission medications   Medication Sig Start Date End Date Taking? Authorizing Provider  albuterol (VENTOLIN HFA) 108 (90 Base) MCG/ACT inhaler Inhale into the lungs.   Yes [provider]  amoxicillin-clavulanate (AUGMENTIN) 875-125 MG tablet Take 1 tablet by mouth 2 (two) times daily for 10 days. 09/19/22 09/29/22 Yes Trevor Iha, FNP  benzonatate (TESSALON) 200 MG capsule Take 1 capsule (200 mg total) by mouth 3 (three) times daily as needed for up to 7 days. 09/19/22 09/26/22 Yes Trevor Iha, FNP  fexofenadine [facility] ALLERGY) 180 MG tablet Take 1 tablet (180 mg total) by mouth daily for 15 days. 09/19/22 10/04/22 Yes Trevor Iha, FNP  predniSONE (STERAPRED UNI-PAK 21 TAB) 10 MG (21) TBPK tablet Take by mouth daily. Take 6 tabs by mouth daily  for 2 days, then 5 tabs for 2  days, then 4 tabs for 2 days, then 3 tabs for 2 days, 2 tabs for 2 days, then 1 tab by mouth daily for 2 days 09/19/22  Yes Trevor Iha, FNP  Zinc 50 MG TABS Take by mouth.   Yes [provider]  azelastine (ASTELIN) 0.1 % nasal spray 2 sprays per nostril twice daily as needed for runny nose 05/04/20   Marcelyn Bruins, MD    Family History Family History  Problem Relation Age of Onset   Diabetes Mother    Heart failure Father    Diabetes Sister    Allergic rhinitis Neg Hx    Angioedema Neg Hx    Asthma Neg Hx    Eczema Neg Hx    Immunodeficiency Neg Hx    Urticaria Neg Hx     Social History Social History   Tobacco Use   Smoking status: Never   Smokeless tobacco: Never  Vaping Use   Vaping status: Never Used  Substance Use Topics   Alcohol use: Yes    Alcohol/week: 5.0 standard drinks of alcohol    Types: 5 Standard drinks or equivalent per week    Comment: on weekends   Drug use: No     Allergies   Patient has no known allergies.   Review of Systems Review of Systems  HENT:  Positive for congestion, postnasal drip, rhinorrhea, sinus pressure and sinus pain.   Respiratory:  Positive for cough.   All other systems reviewed and are negative.    Physical Exam Triage Vital Signs ED Triage Vitals  Encounter Vitals Group     BP      Systolic BP Percentile      Diastolic BP Percentile      Pulse      Resp      Temp      Temp src      SpO2      Weight      Height      Head Circumference      Peak Flow      Pain Score      Pain Loc      Pain Education      Exclude from Growth Chart    No data found.  Updated Vital Signs BP 128/84 (BP Location: Right Arm)   Pulse 84   Temp 98.9 F (37.2 C) (Oral)   Resp 18   Ht 5\' 6"  (1.676 m)   Wt 235 lb (106.6 kg)   LMP 09/07/2022 (Exact Date)   SpO2 96%   BMI 37.93 kg/m   Physical Exam Vitals and nursing note reviewed.  Constitutional:      Appearance: Normal appearance. She is obese.  She is ill-appearing.  HENT:     Head: Normocephalic and atraumatic.     Right Ear: Tympanic membrane and external ear normal.     Left Ear: Tympanic membrane and external ear normal.     Ears:     Comments: Significant eustachian tube dysfunction noted bilaterally    Nose:     Right Sinus: Maxillary sinus tenderness present.     Left Sinus: Maxillary sinus tenderness present.     Comments: Turbinates are erythematous/edematous    Mouth/Throat:     Mouth: Mucous membranes are moist.     Pharynx: Oropharynx is clear.  Eyes:     Extraocular Movements: Extraocular movements intact.     Conjunctiva/sclera: Conjunctivae normal.     Pupils: Pupils are equal, round, and reactive to light.  Cardiovascular:     Rate and Rhythm: Normal rate and regular rhythm.     Pulses: Normal pulses.     Heart sounds: Normal heart sounds.  Pulmonary:     Effort: Pulmonary effort is normal.     Breath sounds: Normal breath sounds. No wheezing, rhonchi or rales.     Comments: Frequent nonproductive cough noted on exam Musculoskeletal:        General: Normal range of motion.     Cervical back: Normal range of motion and neck supple.  Skin:    General: Skin is warm and dry.  Neurological:     General: No focal deficit present.     Mental Status: She is alert and oriented to person, place, and time. Mental status is at baseline.  Psychiatric:        Mood and Affect: Mood normal.      UC Treatments / Results  Labs (all labs ordered are listed, but only abnormal results are displayed) Labs Reviewed - No data to display  EKG   Radiology MM DIAG BREAST TOMO BILATERAL  Result Date: 09/18/2022 CLINICAL DATA:  Short-term follow-up for a probably benign right breast mass, initially assessed with diagnostic mammography and ultrasound on 09/15/2021. EXAM: DIGITAL DIAGNOSTIC BILATERAL MAMMOGRAM WITH TOMOSYNTHESIS AND CAD; ULTRASOUND RIGHT BREAST LIMITED TECHNIQUE:  Bilateral digital diagnostic mammography  and breast tomosynthesis was performed. The images were evaluated with computer-aided detection. ; Targeted ultrasound examination of the right breast was performed COMPARISON:  Previous exam(s). ACR Breast Density Category b: There are scattered areas of fibroglandular density. FINDINGS: The small mass/asymmetry in the upper right breast on the MLO view is unchanged. There are no new breast masses or areas of asymmetry, no areas of architectural distortion and no suspicious calcifications. Targeted right breast ultrasound is performed, showing a tiny cystic lesion at 10 o'clock, 5 cm the nipple, measuring 3 x 2 x 4 mm, 4 x 2 x 4 mm on the most recent prior study and 6 x 3 x 5 mm on the exam from 09/07/2021. IMPRESSION: 1. No evidence of breast malignancy. 2. Small benign complicated cyst in the right breast at 10 o'clock, demonstrating a progressive decrease in size over the last year. RECOMMENDATION: 1.  Screening mammogram in one year.(Code:SM-B-01Y) I have discussed the findings and recommendations with the patient. If applicable, a reminder letter will be sent to the patient regarding the next appointment. BI-RADS CATEGORY  2: Benign. Electronically Signed   By: Amie Portland M.D.   On: 09/18/2022 11:44   US BREAST LTD UNI RIGHT INC AXILLA  Result Date: 09/18/2022 CLINICAL DATA:  Short-term follow-up for a probably benign right breast mass, initially assessed with diagnostic mammography and ultrasound on 09/15/2021. EXAM: DIGITAL DIAGNOSTIC BILATERAL MAMMOGRAM WITH TOMOSYNTHESIS AND CAD; ULTRASOUND RIGHT BREAST LIMITED TECHNIQUE: Bilateral digital diagnostic mammography and breast tomosynthesis was performed. The images were evaluated with computer-aided detection. ; Targeted ultrasound examination of the right breast was performed COMPARISON:  Previous exam(s). ACR Breast Density Category b: There are scattered areas of fibroglandular density. FINDINGS: The small mass/asymmetry in the upper right breast on  the MLO view is unchanged. There are no new breast masses or areas of asymmetry, no areas of architectural distortion and no suspicious calcifications. Targeted right breast ultrasound is performed, showing a tiny cystic lesion at 10 o'clock, 5 cm the nipple, measuring 3 x 2 x 4 mm, 4 x 2 x 4 mm on the most recent prior study and 6 x 3 x 5 mm on the exam from 09/07/2021. IMPRESSION: 1. No evidence of breast malignancy. 2. Small benign complicated cyst in the right breast at 10 o'clock, demonstrating a progressive decrease in size over the last year. RECOMMENDATION: 1.  Screening mammogram in one year.(Code:SM-B-01Y) I have discussed the findings and recommendations with the patient. If applicable, a reminder letter will be sent to the patient regarding the next appointment. BI-RADS CATEGORY  2: Benign. Electronically Signed   By: Amie Portland M.D.   On: 09/18/2022 11:44    Procedures Procedures (including critical care time)  Medications Ordered in UC Medications - No data to display  Initial Impression / Assessment and Plan / UC Course  I have reviewed the triage vital signs and the nursing notes.  Pertinent labs & imaging results that were available during my care of the patient were reviewed by me and considered in my medical decision making (see chart for details).     MDM: 1.  Acute maxillary sinusitis, recurrence not specified-are next Augmentin 875/125 mg tablet twice daily x 10 days; 2.  Sinus pressure-Rx'd Sterapred Unipak (tapering from 60 mg to 10 mg over 10 days); 3.  Allergic rhinitis, unspecified seasonality, unspecified trigger-Rx'd Allegra 180 mg fexofenadine daily x 5 days, then as needed for concurrent postnasal drainage/drip; 4.  Cough, unspecified  type-Rx'd Tessalon Perles 3 times daily, as needed for cough. Advised patient to take medications as directed with food to completion.  Advised patient to take Allegra and prednisone with first dose of Augmentin for the next 10 days.   Advised may discontinue Allegra after 5 days and use as needed for concurrent postnasal drainage/drip.  Encouraged increase daily water intake to 64 ounces per day while taking these medications.  Advised symptoms worsen and/or unresolved please follow-up PCP or here for further evaluation.  Patient discharged home, hemodynamically stable. Final Clinical Impressions(s) / UC Diagnoses   Final diagnoses:  Acute maxillary sinusitis, recurrence not specified  Sinus pressure  Allergic rhinitis, unspecified seasonality, unspecified trigger  Cough, unspecified type     Discharge Instructions      Advised patient to take medications as directed with food to completion.  Advised patient to take Allegra and prednisone with first dose of Augmentin for the next 10 days.  Advised may discontinue Allegra after 5 days and use as needed for concurrent postnasal drainage/drip.  Encouraged increase daily water intake to 64 ounces per day while taking these medications.  Advised symptoms worsen and/or unresolved please follow-up PCP or here for further evaluation.     ED Prescriptions     Medication Sig Dispense Auth. Provider   amoxicillin-clavulanate (AUGMENTIN) 875-125 MG tablet Take 1 tablet by mouth 2 (two) times daily for 10 days. 20 tablet Trevor Iha, FNP   predniSONE (STERAPRED UNI-PAK 21 TAB) 10 MG (21) TBPK tablet Take by mouth daily. Take 6 tabs by mouth daily  for 2 days, then 5 tabs for 2 days, then 4 tabs for 2 days, then 3 tabs for 2 days, 2 tabs for 2 days, then 1 tab by mouth daily for 2 days 42 tablet Trevor Iha, FNP   fexofenadine [facility] ALLERGY) 180 MG tablet Take 1 tablet (180 mg total) by mouth daily for 15 days. 15 tablet Trevor Iha, FNP   benzonatate (TESSALON) 200 MG capsule Take 1 capsule (200 mg total) by mouth 3 (three) times daily as needed for up to 7 days. 40 capsule Trevor Iha, FNP      PDMP not reviewed this encounter.   Trevor Iha, FNP 09/19/22  1649

## 2022-09-19 NOTE — ED Triage Notes (Signed)
Patient c/o possible sinus infection, green nasal drainage, bilateral ear pain; right ear worse than left x 5 days.  Patient has taken Mucinex and Flonase.

## 2022-09-19 NOTE — Discharge Instructions (Addendum)
Advised patient to take medications as directed with food to completion.  Advised patient to take Allegra and prednisone with first dose of Augmentin for the next 10 days.  Advised may discontinue Allegra after 5 days and use as needed for concurrent postnasal drainage/drip.  Encouraged increase daily water intake to 64 ounces per day while taking these medications.  Advised symptoms worsen and/or unresolved please follow-up PCP or here for further evaluation.

## 2023-01-13 IMAGING — MG MM DIGITAL SCREENING BILAT W/ TOMO AND CAD
8 series · 8 of 24 positions shown · non-contrast
Comparison: Previous exam(s).

CLINICAL DATA: Screening.

EXAM:
DIGITAL SCREENING BILATERAL MAMMOGRAM WITH TOMOSYNTHESIS AND CAD
TECHNIQUE: Bilateral screening digital craniocaudal and mediolateral oblique
mammograms were obtained. Bilateral screening digital breast
tomosynthesis was performed. The images were evaluated with
computer-aided detection.

[R CC synth-2D]
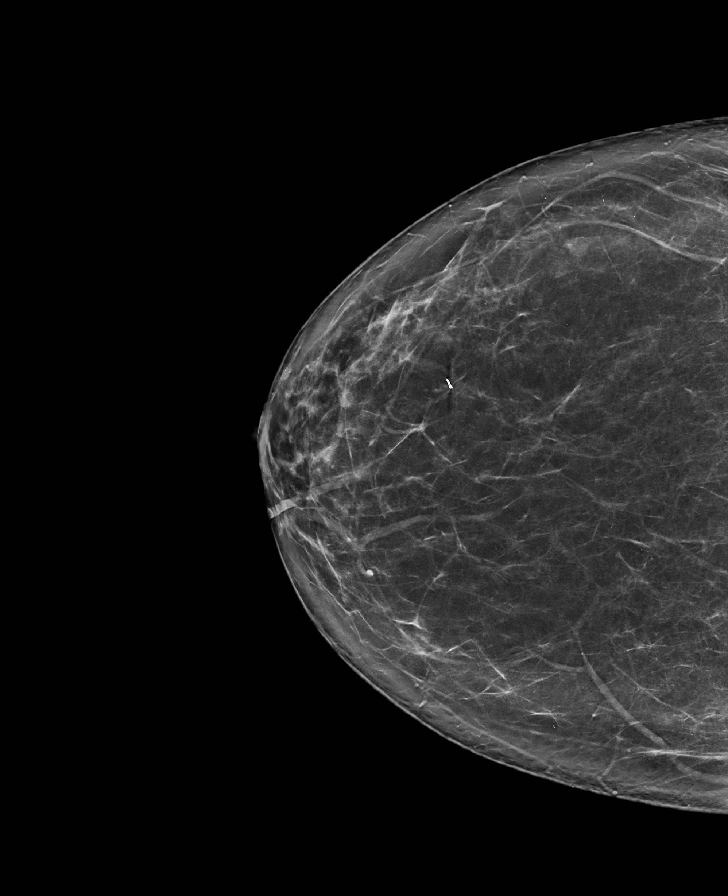

[R MLO synth-2D]
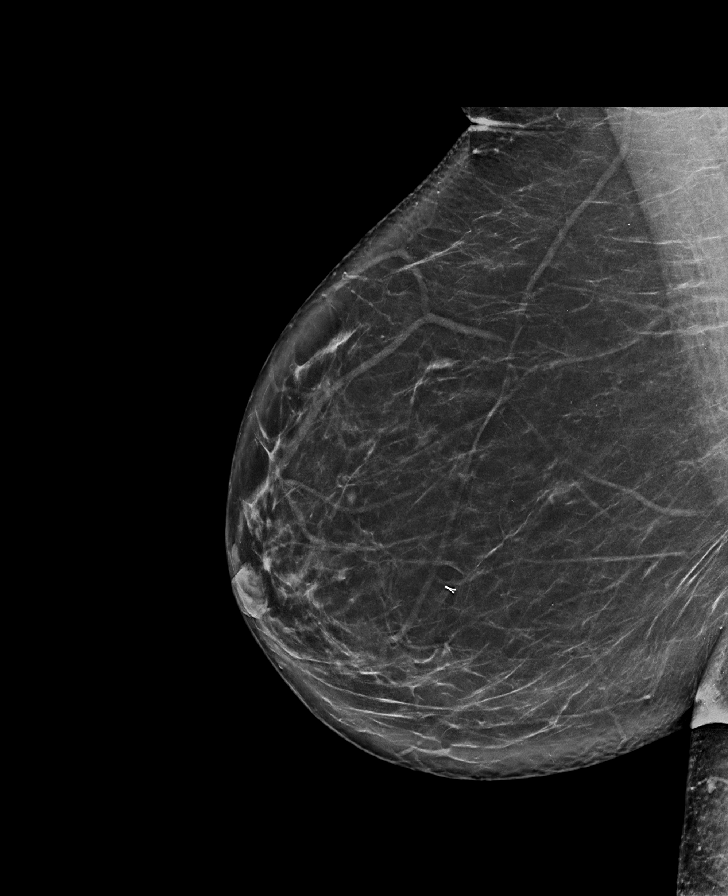

[L CC synth-2D]
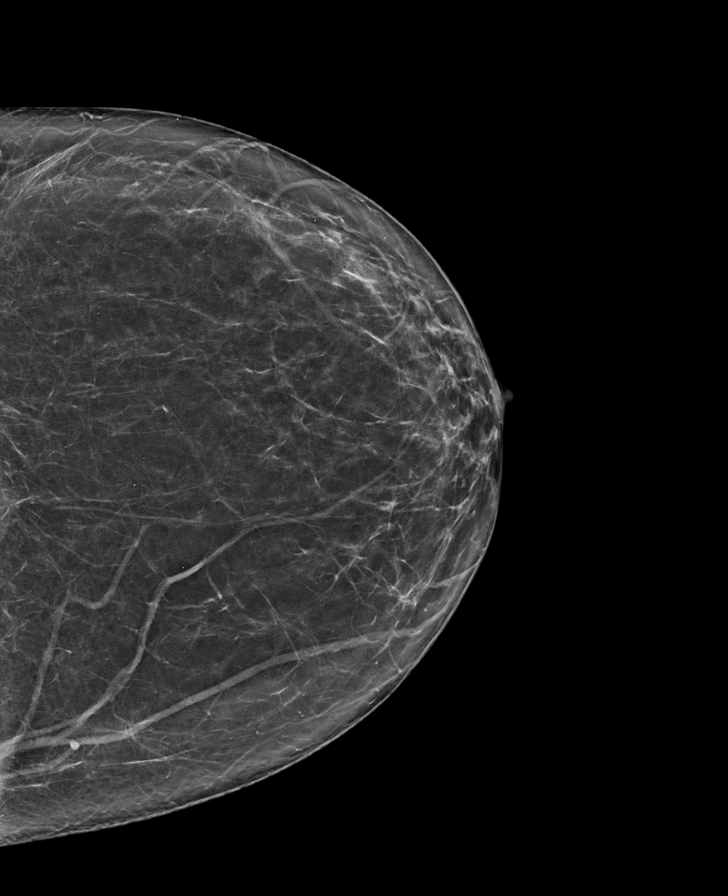

[L MLO synth-2D]
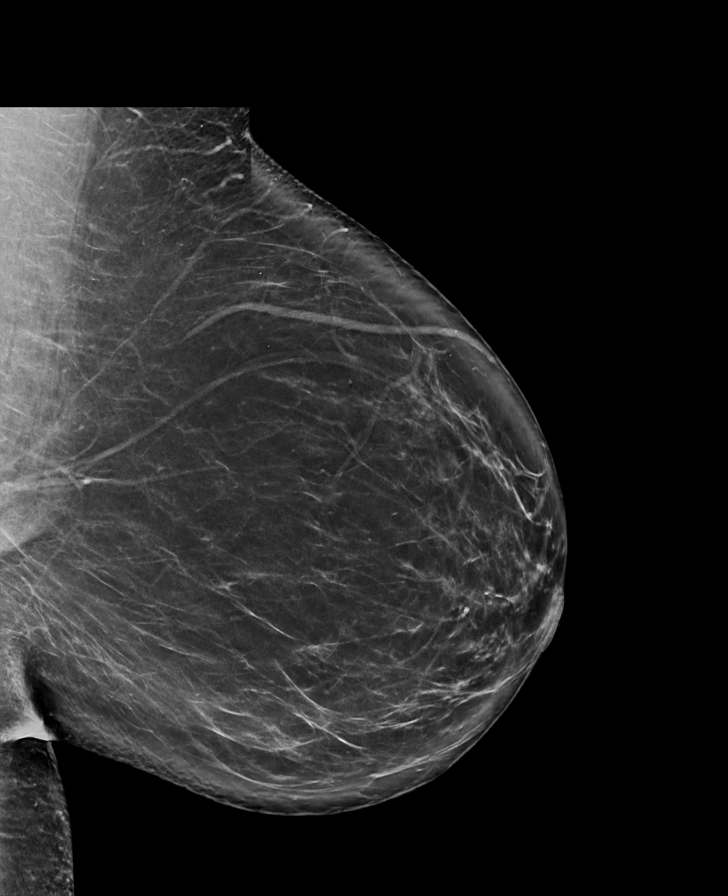

[R MLO tomo · tomo slice 45/89.0]
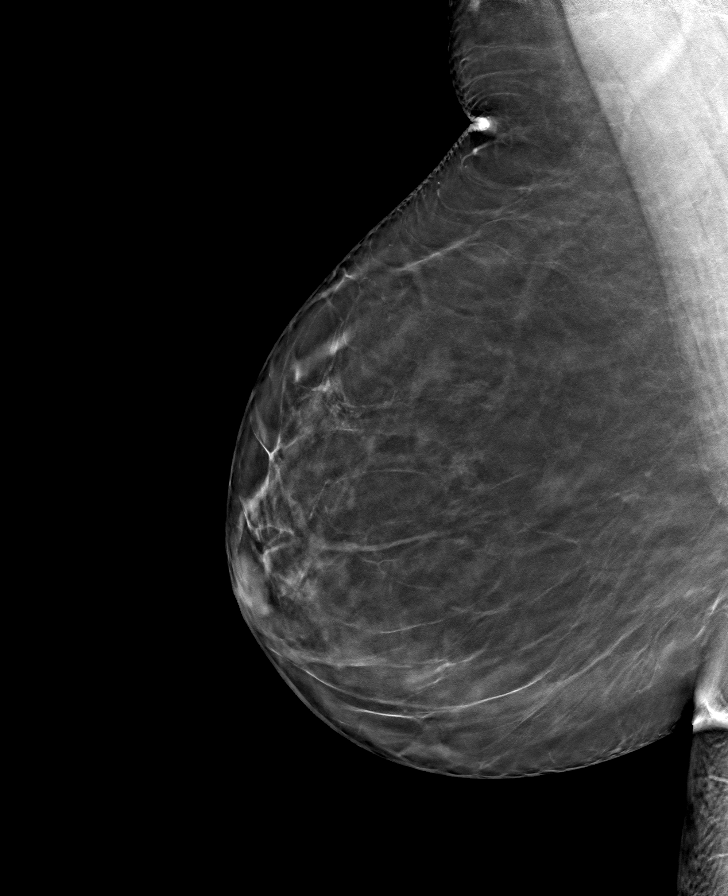

[R CC tomo · tomo slice 43/85.0]
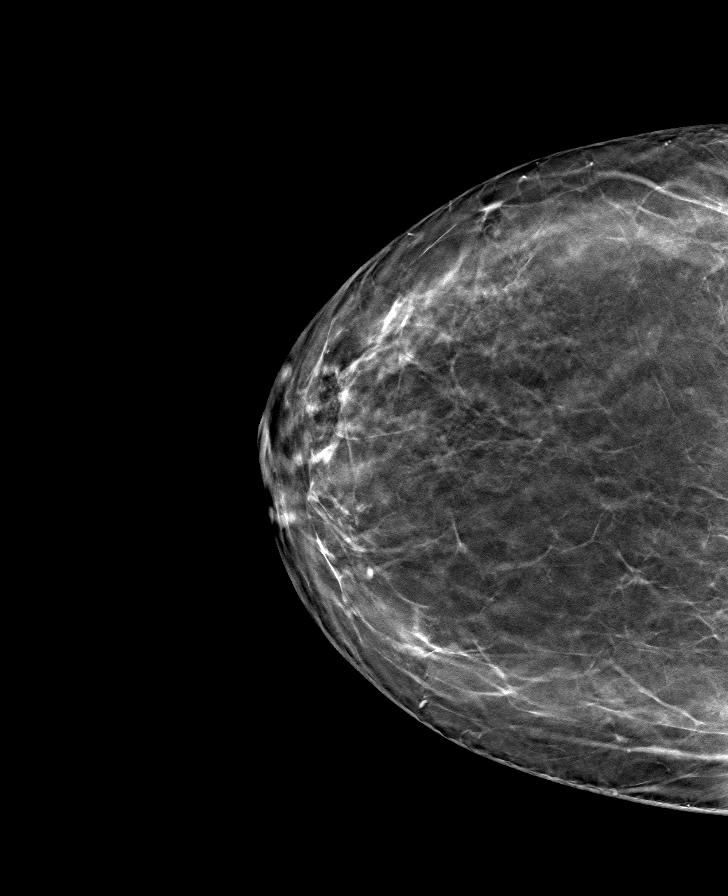

[L MLO tomo · tomo slice 47/92.0]
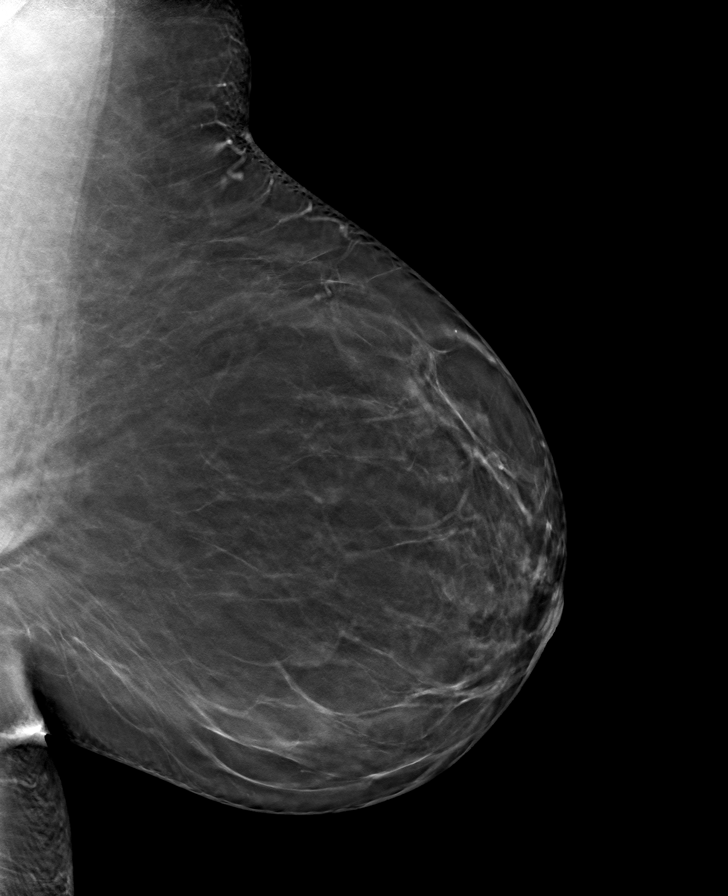

[L CC tomo · tomo slice 41/81.0]
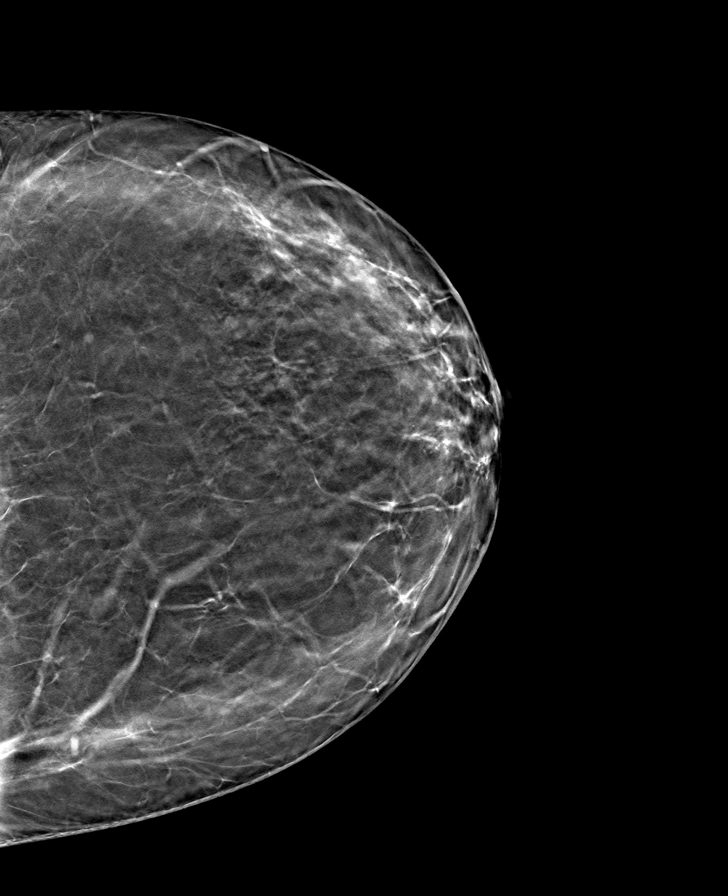

[8 of 24 positions shown; findings below may reference images not displayed]

ACR Breast Density Category b: There are scattered areas of
fibroglandular density.
FINDINGS: There are no findings suspicious for malignancy.
IMPRESSION: No mammographic evidence of malignancy. A result letter of this
screening mammogram will be mailed directly to the patient.

RECOMMENDATION:
Screening mammogram in one year. (Code:51-[id number])

BI-RADS CATEGORY  1: Negative.

## 2023-05-10 ENCOUNTER — Telehealth: Admitting: Physician Assistant

## 2023-05-10 DIAGNOSIS — R110 Nausea: Secondary | ICD-10-CM | POA: Diagnosis not present

## 2023-05-10 DIAGNOSIS — B9689 Other specified bacterial agents as the cause of diseases classified elsewhere: Secondary | ICD-10-CM | POA: Diagnosis not present

## 2023-05-10 DIAGNOSIS — J019 Acute sinusitis, unspecified: Secondary | ICD-10-CM | POA: Diagnosis not present

## 2023-05-10 MED ORDER — ONDANSETRON HCL 4 MG PO TABS: 4.0000 mg | ORAL_TABLET | Freq: Three times a day (TID) | ORAL | 0 refills | Status: AC | PRN

## 2023-05-10 MED ORDER — AMOXICILLIN-POT CLAVULANATE 875-125 MG PO TABS: 1.0000 | ORAL_TABLET | Freq: Two times a day (BID) | ORAL | 0 refills | Status: AC

## 2023-05-10 NOTE — Progress Notes (Signed)
 Virtual Visit Consent   [name], you are scheduled for a virtual visit with a [facility] provider today. Just as with appointments in the office, your consent must be obtained to participate. Your consent will be active for this visit and any virtual visit you may have with one of our providers in the next 365 days. If you have a MyChart account, a copy of this consent can be sent to you electronically.  As this is a virtual visit, video technology does not allow for your provider to perform a traditional examination. This may limit your provider's ability to fully assess your condition. If your provider identifies any concerns that need to be evaluated in person or the need to arrange testing (such as labs, EKG, etc.), we will make arrangements to do so. Although advances in technology are sophisticated, we cannot ensure that it will always work on either your end or our end. If the connection with a video visit is poor, the visit may have to be switched to a telephone visit. With either a video or telephone visit, we are not always able to ensure that we have a secure connection.  By engaging in this virtual visit, you consent to the provision of healthcare and authorize for your insurance to be billed (if applicable) for the services provided during this visit. Depending on your insurance coverage, you may receive a charge related to this service.  I need to obtain your verbal consent now. Are you willing to proceed with your visit today? MARYJEAN CORPENING has provided verbal consent on 05/10/2023 for a virtual visit (video or telephone). Margaretann Loveless, PA-C  Date: 05/10/2023 8:44 AM   Virtual Visit via Video Note   I, Margaretann Loveless, connected with  [name]  ([id number], 1975/10/05) on 05/10/23 at  8:45 AM EDT by a video-enabled telemedicine application and verified that I am speaking with the correct person using two identifiers.  Location: Patient: Virtual Visit  Location Patient: Home Provider: Virtual Visit Location Provider: Home Office   I discussed the limitations of evaluation and management by telemedicine and the availability of in person appointments. The patient expressed understanding and agreed to proceed.    History of Present Illness: [name] is a 48 y.o. who identifies as a female who was assigned female at birth, and is being seen today for sinus congestion and fever.  HPI: Sinusitis This is a new problem. The current episode started in the past 7 days. The problem has been gradually worsening since onset. The maximum temperature recorded prior to her arrival was 101 - 101.9 F (101.8, 100.8, 100.2 this morning). The fever has been present for Less than 1 day. The pain is moderate. Associated symptoms include congestion, ear pain (mild pressure), headaches, sinus pressure and a sore throat (inflamed from drainage). Pertinent negatives include no chills, coughing or shortness of breath. (Post nasal drainage) Treatments tried: mucinex. The treatment provided no relief.     Problems:  Patient Active Problem List   Diagnosis Date Noted   Otalgia 09/29/2007   SOB 07/22/2007   COUGH 07/22/2007   Sinusitis, chronic 06/30/2007   Acute upper respiratory infection 01/09/2007   Asthma 01/09/2007    Allergies: No Known Allergies Medications:  Current Outpatient Medications:    amoxicillin-clavulanate (AUGMENTIN) 875-125 MG tablet, Take 1 tablet by mouth 2 (two) times daily., Disp: 14 tablet, Rfl: 0   ondansetron (ZOFRAN) 4 MG tablet, Take 1 tablet (4 mg total) by  mouth every 8 (eight) hours as needed for nausea or vomiting., Disp: 20 tablet, Rfl: 0   albuterol (VENTOLIN HFA) 108 (90 Base) MCG/ACT inhaler, Inhale into the lungs., Disp: , Rfl:    azelastine (ASTELIN) 0.1 % nasal spray, 2 sprays per nostril twice daily as needed for runny nose, Disp: 30 mL, Rfl: 5   fexofenadine (ALLEGRA ALLERGY) 180 MG tablet, Take 1 tablet (180 mg  total) by mouth daily for 15 days., Disp: 15 tablet, Rfl: 0   predniSONE (STERAPRED UNI-PAK 21 TAB) 10 MG (21) TBPK tablet, Take by mouth daily. Take 6 tabs by mouth daily  for 2 days, then 5 tabs for 2 days, then 4 tabs for 2 days, then 3 tabs for 2 days, 2 tabs for 2 days, then 1 tab by mouth daily for 2 days, Disp: 42 tablet, Rfl: 0   Zinc 50 MG TABS, Take by mouth., Disp: , Rfl:   Observations/Objective: Patient is well-developed, well-nourished in no acute distress.  Resting comfortably at home.  Head is normocephalic, atraumatic.  No labored breathing.  Speech is clear and coherent with logical content.  Patient is alert and oriented at baseline.    Assessment and Plan: 1. Acute bacterial sinusitis (Primary) - amoxicillin-clavulanate (AUGMENTIN) 875-125 MG tablet; Take 1 tablet by mouth 2 (two) times daily.  Dispense: 14 tablet; Refill: 0  2. Nausea - ondansetron (ZOFRAN) 4 MG tablet; Take 1 tablet (4 mg total) by mouth every 8 (eight) hours as needed for nausea or vomiting.  Dispense: 20 tablet; Refill: 0  - Worsening symptoms that have not responded to OTC medications.  - Will give Augmentin - Zofran for nausea - Continue allergy medications.  - Steam and humidifier can help - Stay well hydrated and get plenty of rest.  - Seek in person evaluation if no symptom improvement or if symptoms worsen   Follow Up Instructions: I discussed the assessment and treatment plan with the patient. The patient was provided an opportunity to ask questions and all were answered. The patient agreed with the plan and demonstrated an understanding of the instructions.  A copy of instructions were sent to the patient via MyChart unless otherwise noted below.    The patient was advised to call back or seek an in-person evaluation if the symptoms worsen or if the condition fails to improve as anticipated.    Margaretann Loveless, PA-C

## 2023-05-10 NOTE — Patient Instructions (Signed)
 [name], thank you for joining Margaretann Loveless, PA-C for today's virtual visit.  While this provider is not your primary care provider (PCP), if your PCP is located in our provider database this encounter information will be shared with them immediately following your visit.   A [facility] MyChart account gives you access to today's visit and all your visits, tests, and labs performed at [facility] " click here if you don't have a [facility] MyChart account or go to [email or web address]  Consent: (Patient) [name] provided verbal consent for this virtual visit at the beginning of the encounter.  Current Medications:  Current Outpatient Medications:    amoxicillin-clavulanate (AUGMENTIN) 875-125 MG tablet, Take 1 tablet by mouth 2 (two) times daily., Disp: 14 tablet, Rfl: 0   ondansetron (ZOFRAN) 4 MG tablet, Take 1 tablet (4 mg total) by mouth every 8 (eight) hours as needed for nausea or vomiting., Disp: 20 tablet, Rfl: 0   albuterol (VENTOLIN HFA) 108 (90 Base) MCG/ACT inhaler, Inhale into the lungs., Disp: , Rfl:    azelastine (ASTELIN) 0.1 % nasal spray, 2 sprays per nostril twice daily as needed for runny nose, Disp: 30 mL, Rfl: 5   fexofenadine (ALLEGRA ALLERGY) 180 MG tablet, Take 1 tablet (180 mg total) by mouth daily for 15 days., Disp: 15 tablet, Rfl: 0   predniSONE (STERAPRED UNI-PAK 21 TAB) 10 MG (21) TBPK tablet, Take by mouth daily. Take 6 tabs by mouth daily  for 2 days, then 5 tabs for 2 days, then 4 tabs for 2 days, then 3 tabs for 2 days, 2 tabs for 2 days, then 1 tab by mouth daily for 2 days, Disp: 42 tablet, Rfl: 0   Zinc 50 MG TABS, Take by mouth., Disp: , Rfl:    Medications ordered in this encounter:  Meds ordered this encounter  Medications   amoxicillin-clavulanate (AUGMENTIN) 875-125 MG tablet    Sig: Take 1 tablet by mouth 2 (two) times daily.    Dispense:  14 tablet    Refill:  0    Supervising Provider:   Merrilee Jansky [[id number]]   ondansetron (ZOFRAN) 4 MG tablet    Sig: Take 1 tablet (4 mg total) by mouth every 8 (eight) hours as needed for nausea or vomiting.    Dispense:  20 tablet    Refill:  0    Supervising Provider:   Merrilee Jansky [[id number]]     *If you need refills on other medications prior to your next appointment, please contact your pharmacy*  Follow-Up: Call back or seek an in-person evaluation if the symptoms worsen or if the condition fails to improve as anticipated.  [facility] [phone]  Other Instructions Sinus Infection, Adult A sinus infection, also called sinusitis, is inflammation of your sinuses. Sinuses are hollow spaces in the bones around your face. Your sinuses are located: Around your eyes. In the middle of your forehead. Behind your nose. In your cheekbones. Mucus normally drains out of your sinuses. When your nasal tissues become inflamed or swollen, mucus can become trapped or blocked. This allows bacteria, viruses, and fungi to grow, which leads to infection. Most infections of the sinuses are caused by a virus. A sinus infection can develop quickly. It can last for up to 4 weeks (acute) or for more than 12 weeks (chronic). A sinus infection often develops after a cold. What are the causes? This condition is caused by anything that creates  swelling in the sinuses or stops mucus from draining. This includes: Allergies. Asthma. Infection from bacteria or viruses. Deformities or blockages in your nose or sinuses. Abnormal growths in the nose (nasal polyps). Pollutants, such as chemicals or irritants in the air. Infection from fungi. This is rare. What increases the risk? You are more likely to develop this condition if you: Have a weak body defense system (immune system). Do a lot of swimming or diving. Overuse nasal sprays. Smoke. What are the signs or symptoms? The main symptoms of this condition are pain and a feeling of  pressure around the affected sinuses. Other symptoms include: Stuffy nose or congestion that makes it difficult to breathe through your nose. Thick yellow or greenish drainage from your nose. Tenderness, swelling, and warmth over the affected sinuses. A cough that may get worse at night. Decreased sense of smell and taste. Extra mucus that collects in the throat or the back of the nose (postnasal drip) causing a sore throat or bad breath. Tiredness (fatigue). Fever. How is this diagnosed? This condition is diagnosed based on: Your symptoms. Your medical history. A physical exam. Tests to find out if your condition is acute or chronic. This may include: Checking your nose for nasal polyps. Viewing your sinuses using a device that has a light (endoscope). Testing for allergies or bacteria. Imaging tests, such as an MRI or CT scan. In rare cases, a bone biopsy may be done to rule out more serious types of fungal sinus disease. How is this treated? Treatment for a sinus infection depends on the cause and whether your condition is chronic or acute. If caused by a virus, your symptoms should go away on their own within 10 days. You may be given medicines to relieve symptoms. They include: Medicines that shrink swollen nasal passages (decongestants). A spray that eases inflammation of the nostrils (topical intranasal corticosteroids). Rinses that help get rid of thick mucus in your nose (nasal saline washes). Medicines that treat allergies (antihistamines). Over-the-counter pain relievers. If caused by bacteria, your health care provider may recommend waiting to see if your symptoms improve. Most bacterial infections will get better without antibiotic medicine. You may be given antibiotics if you have: A severe infection. A weak immune system. If caused by narrow nasal passages or nasal polyps, surgery may be needed. Follow these instructions at home: Medicines Take, use, or apply  over-the-counter and prescription medicines only as told by your health care provider. These may include nasal sprays. If you were prescribed an antibiotic medicine, take it as told by your health care provider. Do not stop taking the antibiotic even if you start to feel better. Hydrate and humidify  Drink enough fluid to keep your urine pale yellow. Staying hydrated will help to thin your mucus. Use a cool mist humidifier to keep the humidity level in your home above 50%. Inhale steam for 10-15 minutes, 3-4 times a day, or as told by your health care provider. You can do this in the bathroom while a hot shower is running. Limit your exposure to cool or dry air. Rest Rest as much as possible. Sleep with your head raised (elevated). Make sure you get enough sleep each night. General instructions  Apply a warm, moist washcloth to your face 3-4 times a day or as told by your health care provider. This will help with discomfort. Use nasal saline washes as often as told by your health care provider. Wash your hands often with soap and water to  reduce your exposure to germs. If soap and water are not available, use hand sanitizer. Do not smoke. Avoid being around people who are smoking (secondhand smoke). Keep all follow-up visits. This is important. Contact a health care provider if: You have a fever. Your symptoms get worse. Your symptoms do not improve within 10 days. Get help right away if: You have a severe headache. You have persistent vomiting. You have severe pain or swelling around your face or eyes. You have vision problems. You develop confusion. Your neck is stiff. You have trouble breathing. These symptoms may be an emergency. Get help right away. Call 911. Do not wait to see if the symptoms will go away. Do not drive yourself to the hospital. Summary A sinus infection is soreness and inflammation of your sinuses. Sinuses are hollow spaces in the bones around your  face. This condition is caused by nasal tissues that become inflamed or swollen. The swelling traps or blocks the flow of mucus. This allows bacteria, viruses, and fungi to grow, which leads to infection. If you were prescribed an antibiotic medicine, take it as told by your health care provider. Do not stop taking the antibiotic even if you start to feel better. Keep all follow-up visits. This is important. This information is not intended to replace advice given to you by your health care provider. Make sure you discuss any questions you have with your health care provider. Document Revised: 01/10/2021 Document Reviewed: 01/10/2021 Elsevier Patient Education  2024 [employer]   If you have been instructed to have an in-person evaluation today at a local Urgent Care facility, please use the link below. It will take you to a list of all of our available [facility], including address, phone number and hours of operation. Please do not delay care.  [facility]  If you or a family member do not have a primary care provider, use the link below to schedule a visit and establish care. When you choose a [facility] primary care physician or advanced practice provider, you gain a long-term partner in health. Find a Primary Care Provider  Learn more about [facility]'s in-office and virtual care options:  - Get Care Now

## 2023-06-03 IMAGING — DX DG CHEST 2V
2 series · 2 of 2 positions shown · non-contrast
Comparison: 07/22/2007

CLINICAL DATA: Cough and wheezing for 2 weeks.

EXAM:
CHEST - 2 VIEW

[chest pa]
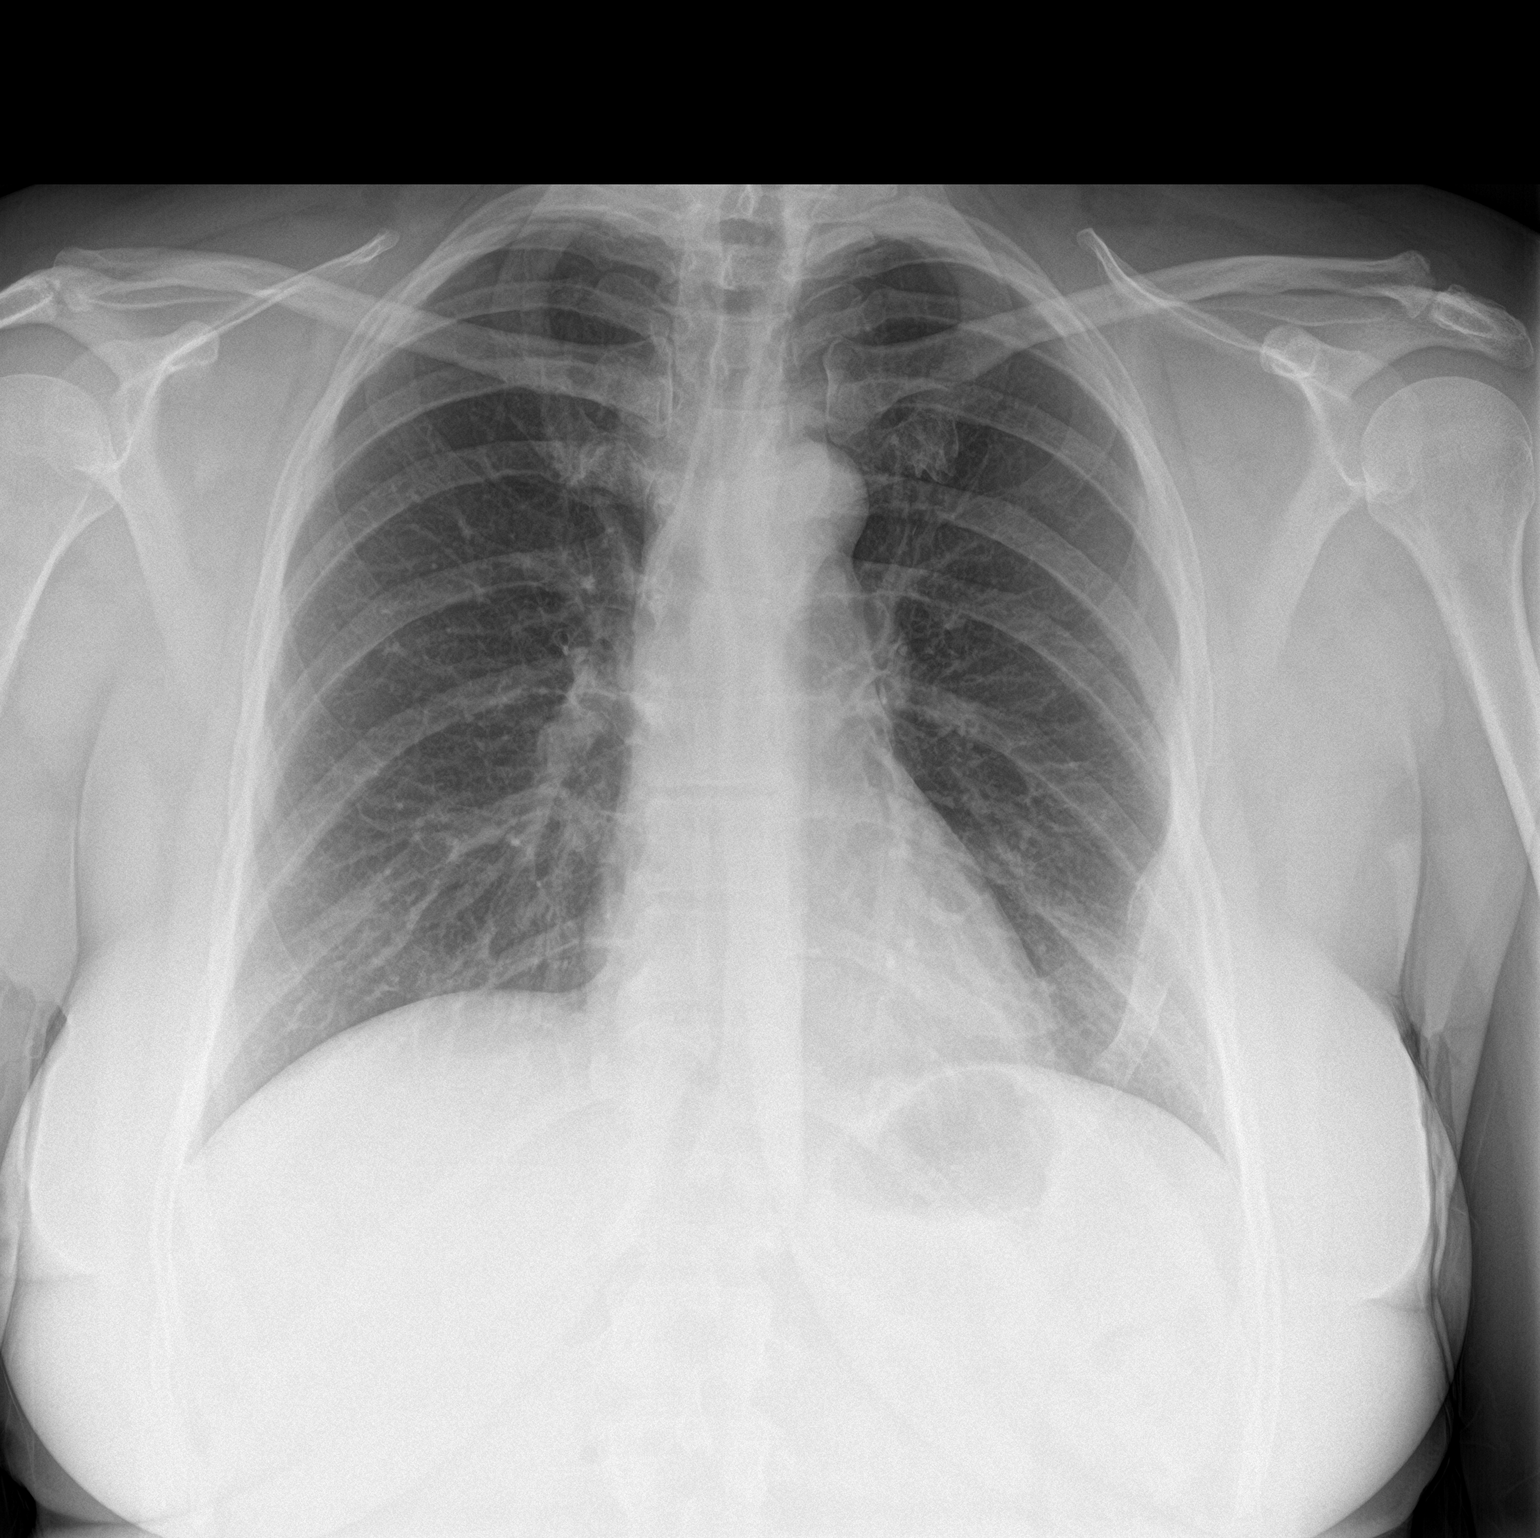

[chest lat]
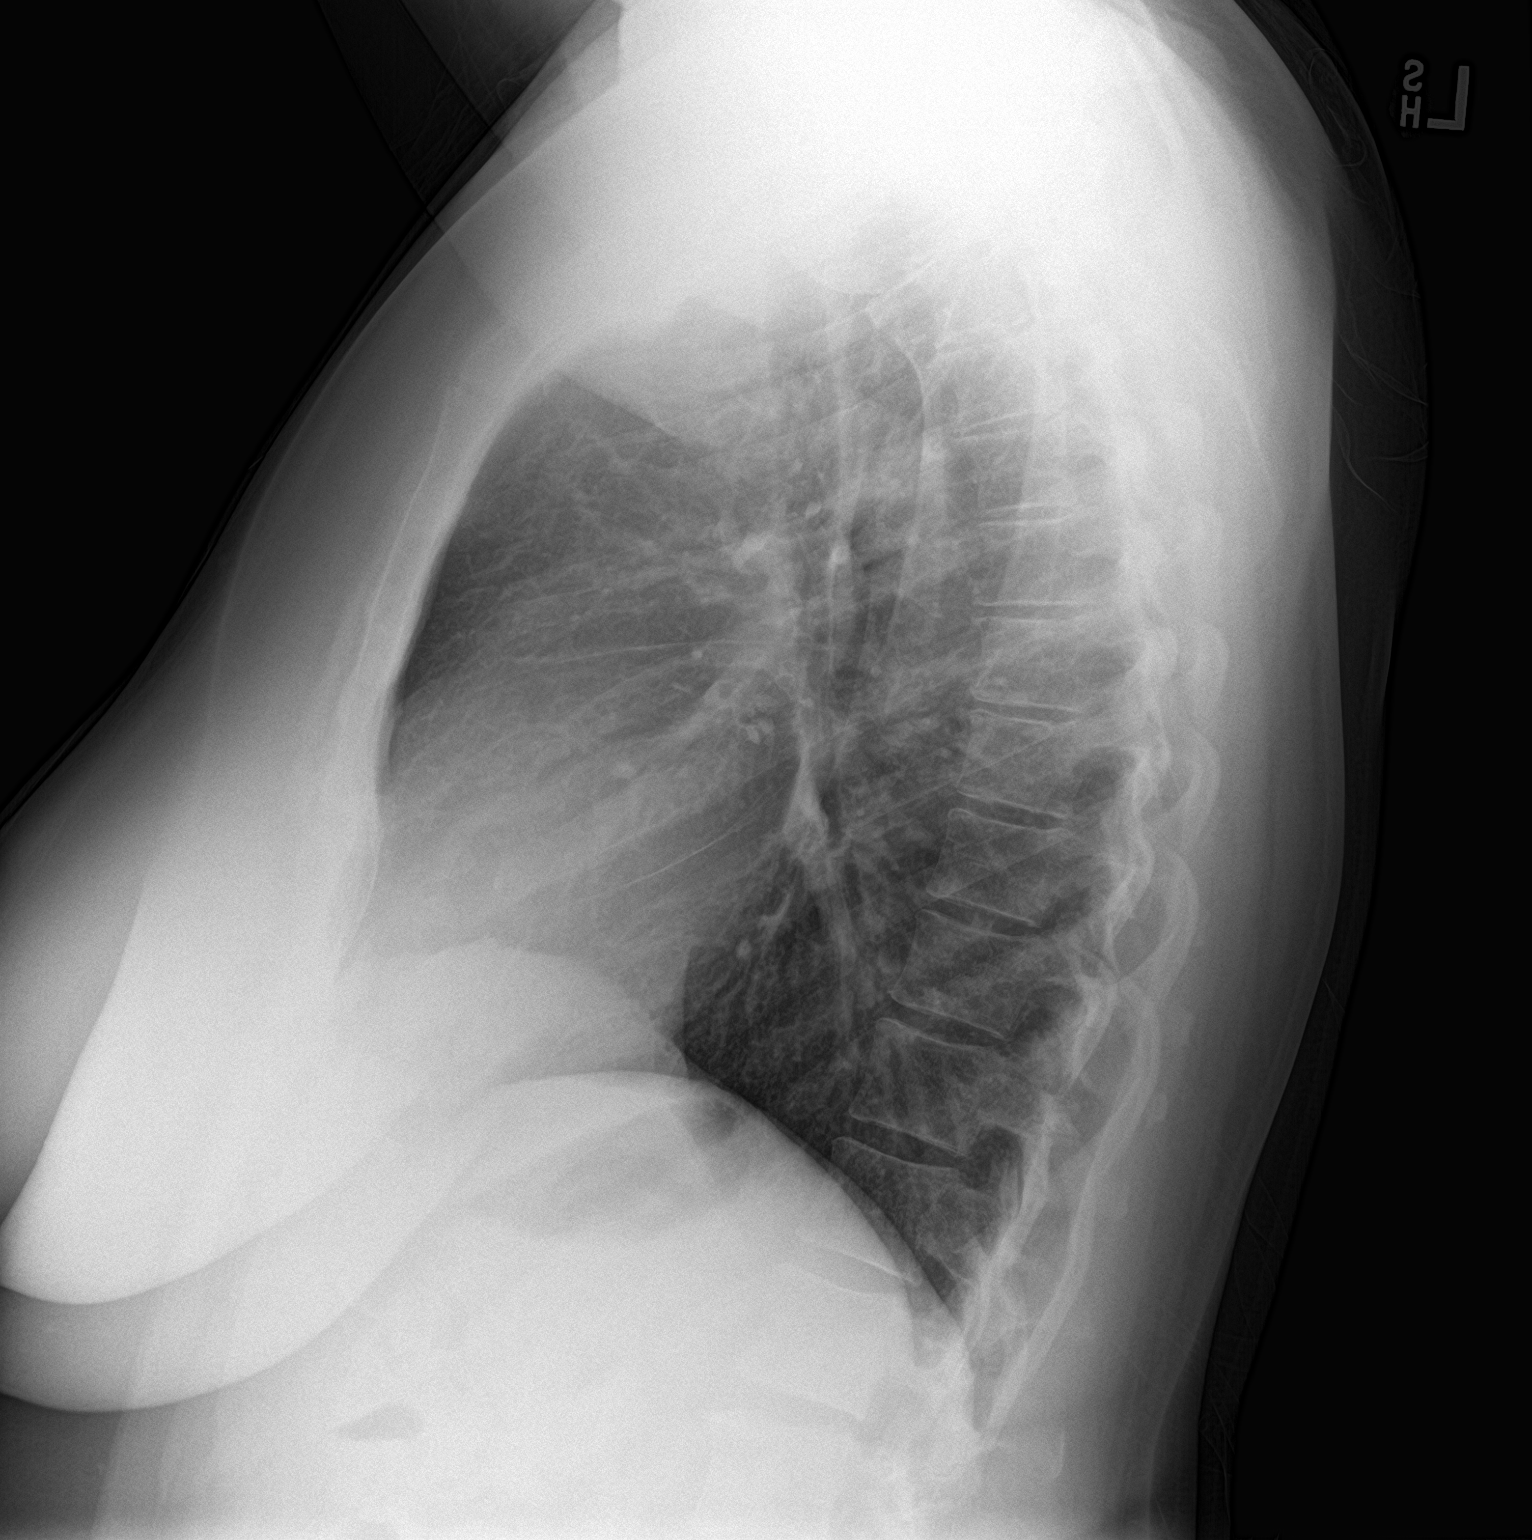

[2 of 2 positions shown; findings below may reference images not displayed]

FINDINGS: The heart size and mediastinal contours are within normal limits.
Both lungs are clear. Chronic deformity of the left lateral 6th rib
is unchanged in appearance.
IMPRESSION: No active cardiopulmonary disease.

## 2023-09-02 ENCOUNTER — Other Ambulatory Visit: Payer: Self-pay | Admitting: Family Medicine

## 2023-09-02 DIAGNOSIS — Z0000 Encounter for general adult medical examination without abnormal findings: Secondary | ICD-10-CM

## 2023-09-19 ENCOUNTER — Ambulatory Visit
Admission: RE | Admit: 2023-09-19 | Discharge: 2023-09-19 | Disposition: A | Discharge status: Home / Self Care | Source: Ambulatory Visit | Attending: Family Medicine | Admitting: Family Medicine

## 2023-09-19 DIAGNOSIS — Z0000 Encounter for general adult medical examination without abnormal findings: Secondary | ICD-10-CM
